# Patient Record
Sex: Female | Born: 1970 | Hispanic: Refuse to answer | Marital: Married | Smoking: Never smoker
Health system: Southern US, Community
[De-identification: ages and names within clinical notes are randomized; demographics above are authoritative.]

## PROBLEM LIST (undated history)

## (undated) DIAGNOSIS — N289 Disorder of kidney and ureter, unspecified: Secondary | ICD-10-CM

## (undated) DIAGNOSIS — M1990 Unspecified osteoarthritis, unspecified site: Secondary | ICD-10-CM

## (undated) DIAGNOSIS — E785 Hyperlipidemia, unspecified: Secondary | ICD-10-CM

## (undated) HISTORY — PX: APPENDECTOMY: SHX54

## (undated) HISTORY — PX: UTERINE FIBROID SURGERY: SHX826

## (undated) HISTORY — DX: Unspecified osteoarthritis, unspecified site: M19.90

## (undated) HISTORY — DX: Hyperlipidemia, unspecified: E78.5

---

## 1998-10-31 ENCOUNTER — Other Ambulatory Visit: Admission: RE | Admit: 1998-10-31 | Discharge: 1998-10-31 | Payer: Self-pay | Admitting: *Deleted

## 1999-06-03 ENCOUNTER — Inpatient Hospital Stay (HOSPITAL_COMMUNITY): Admission: AD | Admit: 1999-06-03 | Discharge: 1999-06-05 | Payer: Self-pay | Admitting: Obstetrics and Gynecology

## 2000-01-09 ENCOUNTER — Other Ambulatory Visit: Admission: RE | Admit: 2000-01-09 | Discharge: 2000-01-09 | Payer: Self-pay | Admitting: Obstetrics & Gynecology

## 2000-08-17 ENCOUNTER — Other Ambulatory Visit: Admission: RE | Admit: 2000-08-17 | Discharge: 2000-08-17 | Payer: Self-pay | Admitting: Obstetrics and Gynecology

## 2001-02-02 ENCOUNTER — Inpatient Hospital Stay (HOSPITAL_COMMUNITY): Admission: AD | Admit: 2001-02-02 | Discharge: 2001-02-02 | Payer: Self-pay | Admitting: Obstetrics and Gynecology

## 2001-03-05 ENCOUNTER — Inpatient Hospital Stay (HOSPITAL_COMMUNITY): Admission: AD | Admit: 2001-03-05 | Discharge: 2001-03-07 | Payer: Self-pay | Admitting: Obstetrics and Gynecology

## 2001-12-13 ENCOUNTER — Other Ambulatory Visit: Admission: RE | Admit: 2001-12-13 | Discharge: 2001-12-13 | Payer: Self-pay | Admitting: Obstetrics and Gynecology

## 2003-04-20 ENCOUNTER — Other Ambulatory Visit: Admission: RE | Admit: 2003-04-20 | Discharge: 2003-04-20 | Payer: Self-pay | Admitting: Obstetrics and Gynecology

## 2003-11-16 ENCOUNTER — Inpatient Hospital Stay (HOSPITAL_COMMUNITY): Admission: AD | Admit: 2003-11-16 | Discharge: 2003-11-18 | Payer: Self-pay | Admitting: Obstetrics and Gynecology

## 2003-12-13 ENCOUNTER — Encounter: Admission: RE | Admit: 2003-12-13 | Discharge: 2004-01-12 | Payer: Self-pay | Admitting: Obstetrics and Gynecology

## 2004-01-13 ENCOUNTER — Encounter: Admission: RE | Admit: 2004-01-13 | Discharge: 2004-02-12 | Payer: Self-pay | Admitting: Obstetrics and Gynecology

## 2004-03-14 ENCOUNTER — Encounter: Admission: RE | Admit: 2004-03-14 | Discharge: 2004-04-13 | Payer: Self-pay | Admitting: Obstetrics and Gynecology

## 2004-04-14 ENCOUNTER — Encounter: Admission: RE | Admit: 2004-04-14 | Discharge: 2004-05-14 | Payer: Self-pay | Admitting: Obstetrics and Gynecology

## 2004-06-14 ENCOUNTER — Encounter: Admission: RE | Admit: 2004-06-14 | Discharge: 2004-07-14 | Payer: Self-pay | Admitting: Obstetrics and Gynecology

## 2004-06-18 ENCOUNTER — Ambulatory Visit: Payer: Self-pay | Admitting: Family Medicine

## 2004-08-14 ENCOUNTER — Encounter: Admission: RE | Admit: 2004-08-14 | Discharge: 2004-08-27 | Payer: Self-pay | Admitting: Obstetrics and Gynecology

## 2005-01-15 ENCOUNTER — Ambulatory Visit: Payer: Self-pay | Admitting: Internal Medicine

## 2005-07-23 ENCOUNTER — Emergency Department (HOSPITAL_COMMUNITY): Admission: EM | Admit: 2005-07-23 | Discharge: 2005-07-23 | Payer: Self-pay | Admitting: Family Medicine

## 2005-08-08 ENCOUNTER — Other Ambulatory Visit: Admission: RE | Admit: 2005-08-08 | Discharge: 2005-08-08 | Payer: Self-pay | Admitting: Obstetrics and Gynecology

## 2005-08-25 ENCOUNTER — Encounter: Admission: RE | Admit: 2005-08-25 | Discharge: 2005-08-25 | Payer: Self-pay | Admitting: Obstetrics and Gynecology

## 2006-07-24 ENCOUNTER — Encounter: Admission: RE | Admit: 2006-07-24 | Discharge: 2006-07-24 | Payer: Self-pay | Admitting: Sports Medicine

## 2006-08-24 ENCOUNTER — Encounter: Admission: RE | Admit: 2006-08-24 | Discharge: 2006-08-24 | Payer: Self-pay | Admitting: Sports Medicine

## 2006-09-14 ENCOUNTER — Encounter: Admission: RE | Admit: 2006-09-14 | Discharge: 2006-09-14 | Payer: Self-pay | Admitting: Sports Medicine

## 2007-05-13 ENCOUNTER — Encounter: Payer: Self-pay | Admitting: Family Medicine

## 2007-10-01 ENCOUNTER — Inpatient Hospital Stay (HOSPITAL_COMMUNITY): Admission: AD | Admit: 2007-10-01 | Discharge: 2007-10-04 | Payer: Self-pay | Admitting: Obstetrics and Gynecology

## 2008-04-14 ENCOUNTER — Ambulatory Visit (HOSPITAL_COMMUNITY): Admission: RE | Admit: 2008-04-14 | Discharge: 2008-04-14 | Payer: Self-pay | Admitting: Obstetrics and Gynecology

## 2008-04-14 ENCOUNTER — Encounter (INDEPENDENT_AMBULATORY_CARE_PROVIDER_SITE_OTHER): Payer: Self-pay | Admitting: Obstetrics and Gynecology

## 2009-03-13 ENCOUNTER — Ambulatory Visit (HOSPITAL_BASED_OUTPATIENT_CLINIC_OR_DEPARTMENT_OTHER): Admission: RE | Admit: 2009-03-13 | Discharge: 2009-03-13 | Payer: Self-pay | Admitting: General Surgery

## 2009-03-13 ENCOUNTER — Encounter (INDEPENDENT_AMBULATORY_CARE_PROVIDER_SITE_OTHER): Payer: Self-pay | Admitting: General Surgery

## 2009-08-29 ENCOUNTER — Encounter: Admission: RE | Admit: 2009-08-29 | Discharge: 2009-08-29 | Payer: Self-pay | Admitting: Obstetrics and Gynecology

## 2010-08-20 ENCOUNTER — Ambulatory Visit
Admission: RE | Admit: 2010-08-20 | Discharge: 2010-08-20 | Payer: Self-pay | Source: Home / Self Care | Attending: Internal Medicine | Admitting: Internal Medicine

## 2010-08-20 ENCOUNTER — Other Ambulatory Visit: Payer: Self-pay | Admitting: Internal Medicine

## 2010-08-20 ENCOUNTER — Encounter: Payer: Self-pay | Admitting: Internal Medicine

## 2010-08-20 DIAGNOSIS — J0100 Acute maxillary sinusitis, unspecified: Secondary | ICD-10-CM | POA: Insufficient documentation

## 2010-08-20 LAB — BASIC METABOLIC PANEL
BUN: 13 mg/dL (ref 6–23)
CO2: 30 mEq/L (ref 19–32)
Calcium: 9.1 mg/dL (ref 8.4–10.5)
Chloride: 104 mEq/L (ref 96–112)
Creatinine, Ser: 0.9 mg/dL (ref 0.4–1.2)
GFR: 78.28 mL/min (ref >60.00)
Glucose, Bld: 94 mg/dL (ref 70–99)
Potassium: 4.1 mEq/L (ref 3.5–5.1)
Sodium: 141 mEq/L (ref 135–145)

## 2010-08-20 LAB — CBC WITH DIFFERENTIAL/PLATELET
Basophils Absolute: 0 10*3/uL (ref 0.0–0.1)
Basophils Relative: 0.2 % (ref 0.0–3.0)
Eosinophils Absolute: 0.1 10*3/uL (ref 0.0–0.7)
Eosinophils Relative: 1.3 % (ref 0.0–5.0)
HCT: 37.1 % (ref 36.0–46.0)
Hemoglobin: 12.9 g/dL (ref 12.0–15.0)
Lymphocytes Relative: 18.3 % (ref 12.0–46.0)
Lymphs Abs: 1.7 10*3/uL (ref 0.7–4.0)
MCHC: 34.7 g/dL (ref 30.0–36.0)
MCV: 85.5 fl (ref 78.0–100.0)
Monocytes Absolute: 0.7 10*3/uL (ref 0.1–1.0)
Monocytes Relative: 7.2 % (ref 3.0–12.0)
Neutro Abs: 6.7 10*3/uL (ref 1.4–7.7)
Neutrophils Relative %: 73 % (ref 43.0–77.0)
Platelets: 260 10*3/uL (ref 150.0–400.0)
RBC: 4.34 Mil/uL (ref 3.87–5.11)
RDW: 13.9 % (ref 11.5–14.6)
WBC: 9.2 10*3/uL (ref 4.5–10.5)

## 2010-08-20 LAB — HEPATIC FUNCTION PANEL
ALT: 22 U/L (ref 0–35)
AST: 22 U/L (ref 0–37)
Albumin: 4.2 g/dL (ref 3.5–5.2)
Alkaline Phosphatase: 103 U/L (ref 39–117)
Bilirubin, Direct: 0.1 mg/dL (ref 0.0–0.3)
Total Bilirubin: 0.6 mg/dL (ref 0.3–1.2)
Total Protein: 7 g/dL (ref 6.0–8.3)

## 2010-08-20 LAB — LIPID PANEL
Cholesterol: 229 mg/dL — ABNORMAL HIGH (ref 0–200)
HDL: 40.4 mg/dL (ref >39.00)
Total CHOL/HDL Ratio: 6
Triglycerides: 155 mg/dL — ABNORMAL HIGH (ref 0.0–149.0)
VLDL: 31 mg/dL (ref 0.0–40.0)

## 2010-08-20 LAB — URINALYSIS, ROUTINE W REFLEX MICROSCOPIC
Bilirubin Urine: NEGATIVE
Hemoglobin, Urine: NEGATIVE
Ketones, ur: NEGATIVE
Leukocytes, UA: NEGATIVE
Nitrite: NEGATIVE
Specific Gravity, Urine: >=1.03 (ref 1.000–1.030)
Total Protein, Urine: NEGATIVE
Urine Glucose: NEGATIVE
Urobilinogen, UA: 0.2 (ref 0.0–1.0)
pH: 6 (ref 5.0–8.0)

## 2010-08-20 LAB — TSH: TSH: 0.41 u[IU]/mL (ref 0.35–5.50)

## 2010-08-20 LAB — LDL CHOLESTEROL, DIRECT: Direct LDL: 172.4 mg/dL

## 2010-08-21 DIAGNOSIS — E785 Hyperlipidemia, unspecified: Secondary | ICD-10-CM | POA: Insufficient documentation

## 2010-09-05 NOTE — Assessment & Plan Note (Signed)
Summary: NEW PT---[employer]--#---FORMER [facility] PT--PKG/OFF--STC   Vital Signs:  Patient profile:   40 year old female Height:      67 inches (170.18 cm) Weight:      164.8 pounds (74.91 kg) BMI:     25.90 O2 Sat:      96 % on Room air Temp:     98.4 degrees F (36.89 degrees C) oral Pulse rate:   93 / minute BP sitting:   100 / 70  (left arm) Cuff size:   large  Vitals Entered By: Orlan Leavens RMA (August 20, 2010 2:05 PM)  O2 Flow:  Room air CC: New patient Is Patient Diabetic? No Pain Assessment Patient in pain? no        Primary Care Provider:  Newt Lukes MD  CC:  New patient.  History of Present Illness: new pt to me and our practice, here to est care patient is here today for annual physical. Patient feels well and has no complaints.   also c/o sinus congestion onset 3 weeks ago -  precipitated by URI - ST and fever have improved but cont nasal discharge +max sinus pressure, teeth pain and HA - no allg rhinitis, +sick contacts tx by urg care with biaxin - ST improved but cont nasal discharge   Preventive Screening-Counseling & Management  Alcohol-Tobacco     Alcohol drinks/day: <1     Alcohol Counseling: not indicated; use of alcohol is not excessive or problematic     Smoking Status: never     Tobacco Counseling: not indicated; no tobacco use  Caffeine-Diet-Exercise     Does Patient Exercise: yes     Exercise Counseling: not indicated; exercise is adequate     Depression Counseling: not indicated; screening negative for depression  Safety-Violence-Falls     Seat Belt Counseling: not indicated; patient wears seat belts     Helmet Counseling: not indicated; patient wears helmet when riding bicycle/motocycle     Firearm Counseling: not indicated; uses recommended firearm safety measures     Violence Counseling: not indicated; no violence risk noted     Fall Risk Counseling: not indicated; no significant falls noted  Clinical Review  Panels:  Prevention   Last Mammogram:  No specific mammographic evidence of malignancy.  [facility].   Assessment: BIRADS 1. (08/29/2009)  Immunizations   Last Tetanus Booster:  Tdap (08/20/2010)   Current Medications (verified): 1)  Clarithromycin 250 Mg Tabs (Clarithromycin) .... 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 I have reviewed all other systems and they were negative.   Physical Exam  General:  alert, well-developed, well-nourished, and cooperative to examination.   fatigued appearing Head:  Normocephalic and atraumatic without obvious abnormalities. No apparent alopecia or balding. Eyes:  vision grossly intact; pupils equal, round and reactive to light.  conjunctiva and lids normal.    Ears:  normal pinnae bilaterally, without erythema, swelling, or  tenderness to palpation. TMs hazy, without effusion, or cerumen impaction. Hearing grossly normal bilaterally  Nose:  tender over maxillary sinus region B Mouth:  teeth and gums in good repair; mucous membranes moist, without lesions or ulcers. oropharynx clear without exudate, mild erythema. +PND Neck:  supple, full ROM, no masses, no thyromegaly; no thyroid nodules or tenderness. no JVD or carotid bruits.   Lungs:  normal respiratory effort, no intercostal retractions or use of accessory muscles; normal breath sounds bilaterally - no crackles and no  wheezes.    Heart:  normal rate, regular rhythm, no murmur, and no rub. BLE without edema. Abdomen:  soft, non-tender, normal bowel sounds, no distention; no masses and no appreciable hepatomegaly or splenomegaly.   Genitalia:  defer gyn Msk:  No deformity or scoliosis noted of thoracic or lumbar spine.   Neurologic:  alert & oriented X3 and cranial nerves II-XII symetrically intact.  strength normal in all extremities, sensation intact to light touch, and gait normal. speech fluent without dysarthria or aphasia; follows commands with good comprehension.  Skin:  no rashes, vesicles, ulcers, or erythema. No nodules or irregularity to palpation.  Psych:  Oriented X3, memory intact for recent and remote, normally interactive, good eye contact, not anxious appearing, not depressed appearing, and not agitated.      Impression & Recommendations:  Problem # 1:  PREVENTIVE HEALTH CARE (ICD-V70.0) Patient has been counseled on age-appropriate routine health concerns for screening and prevention. These are reviewed and up-to-date. Immunizations are up-to-date or declined. Labs ordered and ECG reviewed.  Orders: TLB-Lipid Panel (80061-LIPID) TLB-BMP (Basic Metabolic Panel-BMET) (80048-METABOL) TLB-CBC Platelet - w/Differential (85025-CBCD) TLB-Hepatic/Liver Function Pnl (80076-HEPATIC) TLB-TSH (Thyroid Stimulating Hormone) (84443-TSH) TLB-Udip w/ Micro (81001-URINE) EKG w/ Interpretation (93000)  Problem # 2:  ACUTE MAXILLARY SINUSITIS (ICD-461.0)  Her updated medication list for this problem includes:    Augmentin 875-125 Mg Tabs (Amoxicillin-pot clavulanate) .Marland Kitchen... 1 by mouth two times a day x 7 days    Nasonex 50 Mcg/act Susp (Mometasone furoate) .Marland Kitchen... 2 sprays each nostril every morning  Instructed on treatment. Call if symptoms persist or worsen.   Orders: Prescription Created Electronically [phone])  Complete Medication List: 1)  Augmentin 875-125 Mg Tabs (Amoxicillin-pot clavulanate)  .Marland Kitchen.. 1 by mouth two times a day x 7 days 2)  Nasonex 50 Mcg/act Susp (Mometasone furoate) .... 2 sprays each nostril every morning  Other Orders: Tdap => 74yrs IM (60454) Admin 1st Vaccine (09811)  Patient Instructions: 1)  it was good to see you today. 2)  test(s) ordered today - your results will be posted on the phone tree for review in 48-72 hours from the time of test completion; call [phone] and enter your 9 digit MRN (listed above on this page, just below your name); if any changes need to be made or there are abnormal results, you will be contacted directly.  3)  stop clarithromycin and start Augmentin for sinus infection symptoms - also start nose spray as discussed (nasonex or generic) - your prescriptions have been electronically submitted to your pharmacy. Please take as directed. Contact our office if you believe you're having problems with the medication(s).  4)  ok to use Afrin and breathe rite strips for congestion 5)  Please schedule a follow-up appointment annually for medical physical, call sooner if problems.  Prescriptions: NASONEX 50 MCG/ACT SUSP (MOMETASONE FUROATE) 2 sprays each nostril every morning  #1 x 1   Entered and Authorized by:   Newt Lukes MD  Signed by:   Newt Lukes MD on 08/20/2010   Method used:   Electronically to        [facility] [phone]* (retail)       [address]       [place], [place]  [address]       Ph: [phone] or [phone]       Fax: [phone]   RxID:   [id number] AUGMENTIN 875-125 MG TABS (AMOXICILLIN-POT CLAVULANATE) 1 by mouth two times a day x 7 days  #14 x 0   Entered and Authorized by:   Newt Lukes MD   Signed by:   Newt Lukes MD on 08/20/2010   Method used:   Electronically to        [facility] [phone]* (retail)       [address]       [place], [place]  [address]       Ph: [phone] or [phone]       Fax: [phone]   RxID:   [id number]    Orders Added: 1)   TLB-Lipid Panel [80061-LIPID] 2)  TLB-BMP (Basic Metabolic Panel-BMET) [80048-METABOL] 3)  TLB-CBC Platelet - w/Differential [85025-CBCD] 4)  TLB-Hepatic/Liver Function Pnl [80076-HEPATIC] 5)  TLB-TSH (Thyroid Stimulating Hormone) [84443-TSH] 6)  TLB-Udip w/ Micro [81001-URINE] 7)  EKG w/ Interpretation [93000] 8)  Tdap => 70yrs IM [90715] 9)  Admin 1st Vaccine [90471] 10)  New Patient 40-64 years [99386] 35)  New Patient Level II [99202] 12)  Prescription Created Electronically [phone]   Immunizations Administered:  Tetanus Vaccine:    Vaccine Type: Tdap    Site: left deltoid    Mfr: Sanofi Pasteur    Dose: 0.5 ml    Route: IM    Given by: Zella Ball Ewing CMA (AAMA)    Exp. Date: 11/21/2011    Lot #: [id number]    VIS given: 06/21/08 version given August 20, 2010.   Immunizations Administered:  Tetanus Vaccine:    Vaccine Type: Tdap    Site: left deltoid    Mfr: Sanofi Pasteur    Dose: 0.5 ml    Route: IM    Given by: Zella Ball Ewing CMA (AAMA)    Exp. Date: 11/21/2011    Lot #: [id number]    VIS given: 06/21/08 version given August 20, 2010.   Mammogram  Procedure date:  08/29/2009  Findings:      No specific mammographic evidence of malignancy.  [facility].   Assessment: BIRADS 1.

## 2010-11-09 LAB — CBC
HCT: 42.7 % (ref 36.0–46.0)
Hemoglobin: 13.8 g/dL (ref 12.0–15.0)
MCV: 88.9 fL (ref 78.0–100.0)

## 2010-11-09 LAB — PREGNANCY, URINE: Preg Test, Ur: NEGATIVE

## 2010-12-17 NOTE — Op Note (Signed)
NAMEJAYMIE, Jillian [name]           ACCOUNT NO.:  [id number]   MEDICAL RECORD NO.:  [id number]          PATIENT TYPE:  AMB   LOCATION:  [facility]                           [facility]ACILITY:  [facility]   PHYSICIAN:  Osborn Coho, M.D.   DATE OF BIRTH:  1970-10-30   DATE OF PROCEDURE:  04/14/2008  DATE OF DISCHARGE:                               OPERATIVE REPORT   PREOPERATIVE DIAGNOSES:  1. Chronic pelvic pain.  2. Menorrhagia.  3. Polyps.   POSTOPERATIVE DIAGNOSES:  1. Chronic pelvic pain.  2. Menorrhagia.  3. Polyps.   PROCEDURE:  1. Diagnostic laparoscopy.  2. Hysteroscopy.  3. D&C.   ATTENDING:  Osborn Coho, MD   ANESTHESIA:  General via endotracheal tube.   SPECIMENS TO PATHOLOGY:  Endometrial curettings.   FLUIDS:  1000 mL   URINE OUTPUT:  200 mL   ESTIMATED BLOOD LOSS:  Minimal.   Hysteroscopic fluid deficit of sorbitol, 55 mL.   COMPLICATIONS:  None   PROCEDURE:  The patient was taken to the operating room after the risks,  benefits, and alternatives were discussed with the patient.  The patient  verbalized understanding and consent signed and witnessed.  The patient  was placed under general anesthesia and prepped and draped in normal  sterile fashion in the dorsal lithotomy position.  A bivalve speculum  was placed in the patient's vagina and Hulka tenaculum placed for  intrauterine manipulation.  The bivalve speculum was removed and after  gowning and re-gloving, attention was then turned to the abdomen where a  10-mm umbilical incision was made.  The Veress needle was introduced  into the intraabdominal cavity and pneumoperitoneum achieved.  The 10-mm  trocar was then advanced to the intraabdominal cavity and laparoscope  introduced as well.  The patient was placed in Trendelenburg and there  appeared to be a window in the midline of the posterior cul-de-sac with  petechial lesions surrounding this area.  The petechial lesions were  mostly on the right lateral aspect  of this area.  The intraabdominal  cavity was irrigated as the patient was with small amount of blood in  the cavity likely secondary to menses.  There was also some adhesions  noted of the bowel to the bilateral adnexa.  The bilateral ovaries and  fallopian tubes otherwise appeared to be within normal limits.  The  patient was flattened and pneumoperitoneum was relieved and fascia  repaired at the umbilicus with a 0 Vicryl via a running stitch and the  skin repaired with 3-0 Monocryl via a subcuticular stitch.  Attention  was then turned to the perineum where the Hulka tenaculum was removed  after bivalve speculum was placed.  A sponge stick was placed on the  anterior lip of the cervix and uterus was already dilated and  hysteroscope was introduced and no obvious intracavitary lesions were  noted.  Curettage was performed and a gritty texture noted.  Curettings  was sent to pathology.  A paracervical block had been administered using  a total of 10 mL of 1% lidocaine prior to inserting the scope.  The  patient had also been given  10 mL of 0.5% Marcaine at the umbilical  incision prior to making the incision.  The hysteroscope was introduced  once again and no obvious intracavitary lesions were noted.  All  instruments were removed and there was some bleeding at the tenaculum  site where the Hulka tenaculum had been placed.  Silver nitrate was  applied to this area and good hemostasis was noted.  Count was correct.  The patient tolerated the procedure well and has been transferred to the  recovery room in good condition.      Osborn Coho, M.D.  Electronically Signed     AR/MEDQ  D:  04/14/2008  T:  04/15/2008  Job:  [id number]

## 2010-12-17 NOTE — H&P (Signed)
Jillian [name], [name]           ACCOUNT NO.:  [id number]   MEDICAL RECORD NO.:  [id number]          PATIENT TYPE:  INP   LOCATION:  9175                          [facility]ACILITY:  [facility]   PHYSICIAN:  Osborn Coho, M.D.   DATE OF BIRTH:  1971-04-12   DATE OF ADMISSION:  10/01/2007  DATE OF DISCHARGE:                              HISTORY & PHYSICAL   HISTORY OF PRESENT ILLNESS:  [name] is a 40 year old  gravida 5,  para 3-0-1-3 at 30 weeks who presented with uterine contractions every 4  minutes, decreased strength over the last 24 hours with nausea and  vomiting.  Her pregnancy has been remarkable for:  1. History of rapid labor.  2. History of large for gestational age infant x2.  3. History of positive group B strep, negative this pregnancy.  4. 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  5. History of cardiac consult secondary to tachycardia with negative      findings.  6. Advanced maternal age with negative or normal nuchal translucency,      and normal AFP.   PRENATAL LABS:  Blood type is A+, Rh antibody negative, VDRL  nonreactive, rubella titer positive, hepatitis B surface antigen  negative, HIV was nonreactive, GC and chlamydia cultures were negative  in the first trimester.  PAP was done prior just prior to pregnancy, and  was normal.  Cystic fibrosis testing was negative.  TSH was done after  her first visit secondary to report of racing heart beat, and it was  0.356, hemoglobin upon entering the practice was 12.7.  It was within  normal limits at 28 weeks.  Group B strep culture was negative at 36  weeks Glucola was also normal.  First trimester screen was normal, and  AFP was normal.   HISTORY OF PRESENT PREGNANCY:  The patient entered care at approximately  10-11 weeks.  She had some history of racing heart rate and hair loss.  TSH was done, and was within normal limits.  She is also treated for BV  at her first visit.  She also had a cardiology  consult. AFP and first  trimester screen were normal.  She had an ultrasound at 18 weeks showing  normal growth.  EDC of October 14, 2007 was supported, and no anomalies  were noted.  She did have some varicose veins at 18 weeks.  Her Glucola  monitor was normal.  She did not have an echo done.  Holter monitor  results were normal.  The rest of her pregnancy was essentially  uncomplicated.   OBSTETRICAL HISTORY:  In 2000 she had a vaginal birth of a female infant,  weight 9 pounds 3 ounces at 41+ weeks.  She was in labor 24+ hours.  She  had epidural anesthesia.  She had no complications.  In 2002 she had a  vaginal birth of a female infant, weight 8 pounds 13 ounces at 39-3/7  weeks.  She was in labor approximately 4 hours, and had local  anesthesia.  In 2005 she had a vaginal birth of a female infant, weight  9 pounds  at 40 weeks.  She was in labor 4 hours, and had epidural  anesthesia.  In 2007 she had a 6-week spontaneous miscarriage.   MEDICAL HISTORY:  1. In 1997 she had an abnormal Pap and had some type of treatment for      her cervix.  The patient was not sure whether it was a LEEP      procedure or a cone biopsy.  2. She had a small fibroid noted in 2007.  3. She reports the usual childhood illnesses.  4. She had a history and 1996 of a UTI and pyelonephritis.  5. She had a fracture of the right ankle at age 48.   SURGICAL HISTORY INCLUDES:  1. Appendectomy in 1985.  2. Her only other hospitalization was for childbirth.   ALLERGIES:  She is allergic to [facility] which causes itching.   FAMILY HISTORY:  Her father has hypertension.  Her mother has varicose  veins.  Her brother had an aneurysm.  Her mother had anemia.  Her  maternal grandmother and paternal grandmother had strokes.  Paternal  grandfather had Alzheimer's dementia.  Maternal grandfather had lung  cancer.  Maternal grandfather and maternal uncle were smokers.  Maternal  uncle and brothers were alcohol users.    GENETIC HISTORY REMARKABLE FOR:  The patient's age of 72 at the time of  delivery.  The patient's niece had polydactyly.  Her niece also had a  cyst on the neck.  The patient's nephew had a hole in the heart, but did  not require surgery.   SOCIAL HISTORY:  The patient is married to the father of baby.  He is  involved and supportive.  His name is Gaffer.  The patient is  Caucasian of the [place] faith.  She is college educated.  She is a  Sports administrator.  Her husband has college plus education, and is a stay-  at-home dad.  She has been followed by the certified nurse midwife  service [facility].  She denies any alcohol, drug, or tobacco  use during this pregnancy.   PHYSICAL EXAM:  VITAL SIGNS:  Stable.  The patient is febrile.  HEENT: Within normal limits.  LUNGS:  Breath sounds are clear.  HEART:  Regular rate and rhythm without murmur.  BREASTS:  Soft and nontender.  ABDOMEN:  Fundal height is approximately 38 cm.  Estimated fetal weight  is 8 to 8-1/2 pounds.  Uterine contractions are every 4 minutes,  moderate quality.  Cervix is 3 cm, 70% vertex at a -1 to -2 station.  Membranes are intact.  Fetal heart rate is reactive with no  decelerations.  Uterine contractions currently are every 4 minutes.  There are no HSV lesions noted, and the patient also denies any  prodrome.  EXTREMITIES:  Deep tendon reflexes are 2+ without clonus.  There is  trace of edema noted.   IMPRESSION:  1. Intrauterine pregnancy at 38 weeks.  2. Early labor.  3. Negative group B strep.   PLAN:  1. Admit to birthing suites to Dr. Su Hilt who is attending physician.  2. Routine certified nurse midwife orders.  3. Will plan artificial rupture of membranes as needed.  May want      epidural as labor progresses, and we will augment as needed.      Renaldo Reel Emilee Hero, C.N.M.      Osborn Coho, M.D.  Electronically Signed    VLL/MEDQ  D:  10/01/2007  T:  10/01/2007  Job:   [id number]

## 2010-12-17 NOTE — Op Note (Signed)
Jillian, [name]           ACCOUNT NO.:  [id number]   MEDICAL RECORD NO.:  [id number]          PATIENT TYPE:  AMB   LOCATION:  [facility]                         [facility]   PHYSICIAN:  Anselm Pancoast. Weatherly, M.D.DATE OF BIRTH:  09/14/1970   DATE OF PROCEDURE:  03/13/2009  DATE OF DISCHARGE:                               OPERATIVE REPORT   PREOPERATIVE DIAGNOSIS:  Internal and external hemorrhoids,  predominantly the patient's right, surgeon examined on the left.   POSTOPERATIVE DIAGNOSIS:  Internal and external hemorrhoids,  predominantly posterior quadrants.   OPERATION:  Internal and external hemorrhoidectomy, 2 quadrants.  General anesthesia.  Lithotomy position.   HISTORY:  Jillian [name] is a 40 year old mother of 4 who was  referred to me by her gynecologist.  She is approximately a year  postpartum and has had problems with hemorrhoids after each of her  children.  She has 4 children, pregnancies in 2000, 2002, 2005, and  2009, and she works as a Environmental health practitioner.  She was seen in  the office approximately a month ago at which time the hemorrhoid on the  patient's left was the largest, really more hemorrhoidal tissue than a  rubber band could manage, and since this has been progressive problem, I  recommended that we excise the area under general anesthesia.  She is  hopeful that can get back to work as soon as possible, and whether a  single quadrant will be performed or both according to what was noted at  the time of her surgery.  She is here today, and on questioning, she  said that really appears to be both sides.  She cannot really tell  whether it is more one side or the other, and permission was obtained to  do the second quadrant if needed.  The patient was taken to the  operative suite.  Induction of general anesthesia, LOA tube, and placed  up in the yellow fin stirrups.  With her in this position, it appears  that she has got really  hemorrhoids in both the left and right,  posterior quadrants are the largest.  I prepped her with Betadine  surgical scrub and solution and draped in a sterile manner and then used  Marcaine with adrenaline and anesthetized approximately 10 mL on the  left and right side.  With the patient in the sterile manner with the  anoscope, it appears that the patient's right is the larger quadrant,  now posterior, but she has got more tissue on the left side than I would  like to leave.  The little area in the front is not as large, and I  elected to go ahead and do the right posterior lateral quadrant,  elevating it of the sphincter, not really stretching the external  sphincter that much, and then the little secondary veins right  externally were sutured with 3-0 chromic, and then using the Bowie clamp  under the large internal quadrant clamped and then removed the  hemorrhoid and then sutured with about 3 U stitches of 2-0 chromic and  then started at the apex with suture of the  internal hemorrhoidal  vessels, then sutured over the clamp, removed the clamp and then closed  the hemorrhoidectomy incision with a running 2-0 chromic.  The external  portion, I did use a few little interrupted 3-0 chromic exteriorly, and  there appears to be good hemostasis.  On the left side, the hemorrhoid  is much smaller.  I elevated it off of the sphincter in a similar manner  and then clamped it with a Bowie.  We used a single U stitch and then  running 2-0 chromic closing the external hemorrhoidectomy incision.  Reinspection of both suture lines revealed good hemostasis with  Xylocaine ointment 5% within the anal canal, using open 4x4 to kind of  hold the ointment in place and then 4x4s and ABDs with the  stretch panties.  The patient tolerated the procedure nicely, hopefully  will have no trouble voiding, and I will see her back in the office in  approximately one week.  She has made arrangements to definitely  be off  work for at least 1 week and hopefully can be limited activity  afterwards.      Anselm Pancoast. Zachery Dakins, M.D.  Electronically Signed     WJW/MEDQ  D:  03/13/2009  T:  03/13/2009  Job:  [id number]
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
## 2011-01-22 ENCOUNTER — Other Ambulatory Visit: Payer: Self-pay | Admitting: Obstetrics and Gynecology

## 2011-01-22 DIAGNOSIS — Z1231 Encounter for screening mammogram for malignant neoplasm of breast: Secondary | ICD-10-CM

## 2011-01-30 ENCOUNTER — Ambulatory Visit
Admission: RE | Admit: 2011-01-30 | Discharge: 2011-01-30 | Disposition: A | Discharge status: Home / Self Care | Payer: 59 | Source: Ambulatory Visit | Attending: Obstetrics and Gynecology | Admitting: Obstetrics and Gynecology

## 2011-01-30 DIAGNOSIS — Z1231 Encounter for screening mammogram for malignant neoplasm of breast: Secondary | ICD-10-CM

## 2011-04-25 LAB — CBC
HCT: 31.1 — ABNORMAL LOW
Hemoglobin: 10.1 — ABNORMAL LOW
Hemoglobin: 11.1 — ABNORMAL LOW
MCHC: 35.9
MCHC: 36
MCV: 88.4
Platelets: 136 — ABNORMAL LOW
RBC: 3.51 — ABNORMAL LOW
RDW: 16.5 — ABNORMAL HIGH
WBC: 11.3 — ABNORMAL HIGH
WBC: 16.1 — ABNORMAL HIGH

## 2011-04-25 LAB — RPR: RPR Ser Ql: NONREACTIVE

## 2011-04-28 LAB — CBC
HCT: 29.1 — ABNORMAL LOW
Hemoglobin: 10.4 — ABNORMAL LOW
MCHC: 35.6
MCV: 89.7
RBC: 3.25 — ABNORMAL LOW
RDW: 16 — ABNORMAL HIGH

## 2011-05-07 LAB — CBC
Hemoglobin: 14
MCHC: 34.3
RBC: 4.69
RDW: 13.7
WBC: 5

## 2011-05-07 LAB — HCG, SERUM, QUALITATIVE: Preg, Serum: NEGATIVE

## 2011-10-09 ENCOUNTER — Ambulatory Visit (INDEPENDENT_AMBULATORY_CARE_PROVIDER_SITE_OTHER): Payer: 59 | Admitting: Family Medicine

## 2011-10-09 VITALS — BP 125/86 | HR 69 | Temp 98.4°F | Resp 16 | Ht 66.25 in | Wt 168.2 lb

## 2011-10-09 DIAGNOSIS — IMO0002 Reserved for concepts with insufficient information to code with codable children: Secondary | ICD-10-CM

## 2011-10-09 DIAGNOSIS — L089 Local infection of the skin and subcutaneous tissue, unspecified: Secondary | ICD-10-CM

## 2011-10-09 MED ORDER — SULFAMETHOXAZOLE-TRIMETHOPRIM 800-160 MG PO TABS: 1.0000 | ORAL_TABLET | Freq: Two times a day (BID) | ORAL | Status: AC

## 2011-10-09 NOTE — Progress Notes (Signed)
  [facility]:  Office Visit  Chief Complaint:  Chief Complaint  Patient presents with  . Laceration    left index finger under the nail    HPI: Jillian [name] is a 41 y.o. female who complains of left index finger pain, after opening box, cardboard box, digged into her finger, ? Cut x 2 days. Cleaned with soap and water. No fevers, chills. + pain, swelling.  Treated with abx neosporin  History reviewed. No pertinent past medical history. Past Surgical History  Procedure Date  . Appendectomy    History   Social History  . Marital Status: Married    Spouse Name: N/A    Number of Children: N/A  . Years of Education: N/A   Social History Main Topics  . Smoking status: Never Smoker   . Smokeless tobacco: None  . Alcohol Use: Yes  . Drug Use: No  . Sexually Active: None   Other Topics Concern  . None   Social History Narrative  . None   Family History  Problem Relation Age of Onset  . Heart disease Mother    Allergies  Allergen Reactions  . Ultram (Tramadol Hcl) Itching   Prior to Admission medications   Not on File     ROS: The patient denies fevers, chills, night sweats, unintentional weight loss, chest pain, palpitations, wheezing, dyspnea on exertion, nausea, vomiting, abdominal pain, dysuria, hematuria, melena, numbness, weakness, or tingling. + finger pain and swelling  All other systems have been reviewed and were otherwise negative with the exception of those mentioned in the HPI and as above.    PHYSICAL EXAM: Filed Vitals:   10/09/11 1927  BP: 125/86  Pulse: 69  Temp: 98.4 F (36.9 C)  Resp: 16   Filed Vitals:   10/09/11 1927  Height: 5' 6.25" (1.683 m)  Weight: 168 lb 3.2 oz (76.295 kg)   Body mass index is 26.94 kg/(m^2).  General: Alert, no acute distress HEENT:  Normocephalic, atraumatic, oropharynx patent.  Cardiovascular:  Regular rate and rhythm, no rubs murmurs or gallops.  No Carotid bruits, radial pulse  intact. No pedal edema.  Respiratory: Clear to auscultation bilaterally.  No wheezes, rales, or rhonchi.  No cyanosis, no use of accessory musculature GI: No organomegaly, abdomen is soft and non-tender, positive bowel sounds.  No masses. Skin: No rashes. + wound on left index finger on medial edge of nail, mild swelling and mild redness and tender with palpation Neurologic: Facial musculature symmetric. Full flexion, extension of PIP, DIP, full ROM, sensation intact Psychiatric: Patient is appropriate throughout our interaction. Lymphatic: No cervical lymphadenopathy Musculoskeletal: Gait intact.   LABS:   EKG/XRAY:   Primary read interpreted by Dr. Conley Rolls at [facility].   ASSESSMENT/PLAN: Encounter Diagnoses  Name Primary?  . Skin infection Yes  . Paronychia   . Finger infection    I think this is an early stage of a nail infection/paronychia. Advise patient to monitor for worse sxs which may require incision and draiange. It is not at that point yet. Rx Bactrim DS and warm water soaks BID    Kyheem Bathgate PHUONG, DO 10/11/2011 11:22 AM

## 2011-10-10 ENCOUNTER — Telehealth: Payer: Self-pay

## 2011-10-10 NOTE — Telephone Encounter (Signed)
Pt advised and understood. 

## 2011-10-10 NOTE — Telephone Encounter (Signed)
Note reviewed - tx appears appropriate -  continue antibiotics as rx'd -  Also, she should soak finger in warm soapy water 3x/d for 5-10 min per soak for next 3 days, then as needed - call if increasing redness or pain

## 2011-10-10 NOTE — Telephone Encounter (Signed)
Pt called stating she was seen at [facility] yesterday for infection of index finger. Pt is requesting MD review UC notes and advise.

## 2011-10-10 NOTE — Telephone Encounter (Signed)
Pt called stating she was seen at UC yesterday for infection of index finger. Pt is requesting MD review UC notes and advise.  

## 2012-03-31 ENCOUNTER — Ambulatory Visit (INDEPENDENT_AMBULATORY_CARE_PROVIDER_SITE_OTHER): Payer: 59 | Admitting: Obstetrics and Gynecology

## 2012-03-31 ENCOUNTER — Encounter: Payer: Self-pay | Admitting: Obstetrics and Gynecology

## 2012-03-31 VITALS — BP 100/66 | Resp 16 | Ht 67.0 in | Wt 160.0 lb

## 2012-03-31 DIAGNOSIS — N946 Dysmenorrhea, unspecified: Secondary | ICD-10-CM

## 2012-03-31 DIAGNOSIS — Z124 Encounter for screening for malignant neoplasm of cervix: Secondary | ICD-10-CM

## 2012-03-31 MED ORDER — HYDROCODONE-ACETAMINOPHEN 5-500 MG PO TABS: 1.0000 | ORAL_TABLET | ORAL | Status: DC | PRN

## 2012-03-31 MED ORDER — HYDROCODONE-ACETAMINOPHEN 5-500 MG PO TABS: 1.0000 | ORAL_TABLET | ORAL | Status: AC | PRN

## 2012-03-31 NOTE — Patient Instructions (Signed)
Endometrial Ablation Endometrial ablation removes the lining of the uterus (endometrium). It is usually a same day, outpatient treatment. Ablation helps avoid major surgery (such as a hysterectomy). A hysterectomy is removal of the cervix and uterus. Endometrial ablation has less risk and complications, has a shorter recovery period and is less expensive. After endometrial ablation, most women will have little or no menstrual bleeding. You may not keep your fertility. Pregnancy is no longer likely after this procedure but if you are pre-menopausal, you still need to use a reliable method of birth control following the procedure because pregnancy can occur. REASONS TO HAVE THE PROCEDURE MAY INCLUDE:  Heavy periods.   Bleeding that is causing anemia.   Anovulatory bleeding, very irregular, bleeding.   Bleeding submucous fibroids (on the lining inside the uterus) if they are smaller than 3 centimeters.  REASONS NOT TO HAVE THE PROCEDURE MAY INCLUDE:  You wish to have more children.   You have a pre-cancerous or cancerous problem. The cause of any abnormal bleeding must be diagnosed before having the procedure.   You have pain coming from the uterus.   You have a submucus fibroid larger than 3 centimeters.   You recently had a baby.   You recently had an infection in the uterus.   You have a severe retro-flexed, tipped uterus and cannot insert the instrument to do the ablation.   You had a Cesarean section or deep major surgery on the uterus.   The inner cavity of the uterus is too large for the endometrial ablation instrument.  RISKS AND COMPLICATIONS   Perforation of the uterus.   Bleeding.   Infection of the uterus, bladder or vagina.   Injury to surrounding organs.   Cutting the cervix.   An air bubble to the lung (air embolus).   Pregnancy following the procedure.   Failure of the procedure to help the problem requiring hysterectomy.   Decreased ability to diagnose  cancer in the lining of the uterus.  BEFORE THE PROCEDURE  The lining of the uterus must be tested to make sure there is no pre-cancerous or cancer cells present.   Medications may be given to make the lining of the uterus thinner.   Ultrasound may be used to evaluate the size and look for abnormalities of the uterus.   Future pregnancy is not desired.  PROCEDURE  There are different ways to destroy the lining of the uterus.   Resectoscope - radio frequency-alternating electric current is the most common one used.   Cryotherapy - freezing the lining of the uterus.   Heated Free Liquid - heated salt (saline) solution inserted into the uterus.   Microwave - uses high energy microwaves in the uterus.   Thermal Balloon - a catheter with a balloon tip is inserted into the uterus and filled with heated fluid.  Your caregiver will talk with you about the method used in this clinic. They will also instruct you on the pros and cons of the procedure. Endometrial ablation is performed along with a procedure called operative hysteroscopy. A narrow viewing tube is inserted through the birth canal (vagina) and through the cervix into the uterus. A tiny camera attached to the viewing tube (hysteroscope) allows the uterine cavity to be shown on a TV monitor during surgery. Your uterus is filled with a harmless liquid to make the procedure easier. The lining of the uterus is then removed. The lining can also be removed with a resectoscope which allows your surgeon   to cut away the lining of the uterus under direct vision. Usually, you will be able to go home within an hour after the procedure. HOME CARE INSTRUCTIONS   Do not drive for 24 hours.   No tampons, douching or intercourse for 2 weeks or until your caregiver approves.   Rest at home for 24 to 48 hours. You may then resume normal activities unless told differently by your caregiver.   Take your temperature two times a day for 4 days, and record  it.   Take any medications your caregiver has ordered, as directed.   Use some form of contraception if you are pre-menopausal and do not want to get pregnant.  Bleeding after the procedure is normal. It varies from light spotting and mildly watery to bloody discharge for 4 to 6 weeks. You may also have mild cramping. Only take over-the-counter or prescription medicines for pain, discomfort, or fever as directed by your caregiver. Do not use aspirin, as this may aggravate bleeding. Frequent urination during the first 24 hours is normal. You will not know how effective your surgery is until at least 3 months after the surgery. SEEK IMMEDIATE MEDICAL CARE IF:   Bleeding is heavier than a normal menstrual cycle.   An oral temperature above 102 F (38.9 C) develops.   You have increasing cramps or pains not relieved with medication or develop belly (abdominal) pain which does not seem to be related to the same area of earlier cramping and pain.   You are light headed, weak or have fainting episodes.   You develop pain in the shoulder strap areas.   You have chest or leg pain.   You have abnormal vaginal discharge.   You have painful urination.  Document Released: 05/30/2004 Document Revised: 07/10/2011 Document Reviewed: 08/28/2007 Exit[employer] Patient Information 2012 [employer]. 

## 2012-03-31 NOTE — Progress Notes (Signed)
Regular Periods: yes every 3 weeks  Mammogram: no 01/2011 WNL  Monthly Breast Ex.: yes Exercise: yes  Tetanus < 10 years: unsure per pt  Seatbelts: yes  NI. Bladder Functn.: yes Abuse at home: no  Daily BM's: yes Stressful Work: yes  Healthy Diet: yes Sigmoid-Colonoscopy: never   Calcium: no Medical problems this year: c/o back & neck issues "stenosis",  & disks problems     Contraception: hus vas  Mammogram:  01/2011 WNL  PCP: Rene Paci  PMH: no changes   FMH: mother 2 stents 2012 possible heart attack   Last Bone Scan: never

## 2012-03-31 NOTE — Progress Notes (Signed)
Subjective:    Jillian [name] is a 41 y.o. female, G5P0014, who presents for an annual exam.   Patient reports cycles are still short (q 3 weeks), and heavy with cramping.  Uses Vicodin sporadically for dysmenorrhea. Husband with vasectomy.  Patient has declined hormonal control for cycles in past.  Mother had atypical SOB, resulted in 2 stents placed for 100% blockages.    History   Social History  . Marital Status: Married    Spouse Name: N/A    Number of Children: N/A  . Years of Education: N/A   Social History Main Topics  . Smoking status: Never Smoker   . Smokeless tobacco: Never Used  . Alcohol Use: No  . Drug Use: No  . Sexually Active: Yes -- Female partner(s)    Birth Control/ Protection: Surgical     hus vas    Other Topics Concern  . None   Social History Narrative  . None    Menstrual cycle:   LMP: Patient's last menstrual period was 03/10/2012.           Cycle: q 3 weeks, with dysmenorrhea and heavy flow at times.  The following portions of the patient's history were reviewed and updated as appropriate: allergies, current medications, past family history, past medical history, past social history, past surgical history and problem list.  Review of Systems Pertinent items are noted in HPI. Breast:Negative for breast lump,nipple discharge or nipple retraction Gastrointestinal: Negative for abdominal pain, change in bowel habits or rectal bleeding Urinary:negative   Objective:    BP 100/66  Resp 16  LMP 03/10/2012    Weight:  Wt Readings from Last 1 Encounters:  10/09/11 168 lb 3.2 oz (76.295 kg)          BMI: There is no height or weight on file to calculate BMI.  General Appearance: Alert, appropriate appearance for age. No acute distress HEENT: Grossly normal Neck / Thyroid: Supple, no masses, nodes or enlargement Lungs: clear to auscultation bilaterally Back: No CVA tenderness Breast Exam: No dimpling, nipple retraction or discharge. No  masses or nodes. and No masses or nodes.No dimpling, nipple retraction or discharge. Cardiovascular: Regular rate and rhythm. S1, S2, no murmur Gastrointestinal: Soft, non-tender, no masses or organomegaly Pelvic Exam: Vulva and vagina appear normal. Bimanual exam reveals normal uterus and adnexa. Rectovaginal: normal rectal, no masses Lymphatic Exam: Non-palpable nodes in neck, clavicular, axillary, or inguinal regions Skin: no rash or abnormalities Neurologic: Normal gait and speech, no tremor  Psychiatric: Alert and oriented, appropriate affect.   Wet Prep:not applicable Urinalysis:not applicable UPT: Not done   Assessment:    Normal gyn exam  Dysmenorrhea/sporadic menorrhagia   Plan:    Mammogram: 6/12--patient was unsure of last mammogram Pap:  Done today STD screening: declined Contraception:vasectomy Other:  Discussed options of Mirena, Lysteda, Novasure for cycle issues.  Patient interested in [place].  Procedure discussed, written resources given.  Patient will f/u with [facility] if she wishes to discuss further. Rx Vicodin 1 po q 4 hours prn dysmenorrhea called to patient's pharmacy.      Nyra Capes, [place]

## 2012-04-02 LAB — PAP IG W/ RFLX HPV ASCU

## 2012-04-07 ENCOUNTER — Other Ambulatory Visit: Payer: Self-pay | Admitting: Obstetrics and Gynecology

## 2012-04-07 DIAGNOSIS — Z1231 Encounter for screening mammogram for malignant neoplasm of breast: Secondary | ICD-10-CM

## 2012-04-14 ENCOUNTER — Ambulatory Visit (INDEPENDENT_AMBULATORY_CARE_PROVIDER_SITE_OTHER): Payer: 59 | Admitting: Internal Medicine

## 2012-04-14 ENCOUNTER — Encounter: Payer: Self-pay | Admitting: Internal Medicine

## 2012-04-14 VITALS — BP 112/72 | HR 81 | Temp 98.5°F

## 2012-04-14 DIAGNOSIS — E785 Hyperlipidemia, unspecified: Secondary | ICD-10-CM

## 2012-04-14 DIAGNOSIS — L710 Perioral dermatitis: Secondary | ICD-10-CM

## 2012-04-14 DIAGNOSIS — Z23 Encounter for immunization: Secondary | ICD-10-CM

## 2012-04-14 DIAGNOSIS — H9209 Otalgia, unspecified ear: Secondary | ICD-10-CM

## 2012-04-14 DIAGNOSIS — L719 Rosacea, unspecified: Secondary | ICD-10-CM

## 2012-04-14 MED ORDER — ANTIPYRINE-BENZOCAINE 5.4-1.4 % OT SOLN: 3.0000 [drp] | OTIC | Status: AC | PRN

## 2012-04-14 MED ORDER — METRONIDAZOLE 1 % EX GEL: Freq: Every day | CUTANEOUS | Status: AC

## 2012-04-14 NOTE — Progress Notes (Signed)
  Subjective:    Patient ID: Jillian [name], female    DOB: 09-29-1970, 41 y.o.   MRN: [id number]  Otalgia  There is pain in the left ear. This is a chronic problem. The current episode started more than 1 month ago. The problem occurs constantly. The problem has been unchanged. There has been no fever. Associated symptoms include a sore throat. She has tried ear drops, NSAIDs and acetaminophen for the symptoms. The treatment provided no relief. There is no history of a chronic ear infection or hearing loss.   also complains of recurrent rash near mouth, esthetician has recommended medical evaluation for treatment  Past Medical History  Diagnosis Date  . Hyperlipidemia     Review of Systems  HENT: Positive for ear pain and sore throat.        Objective:   Physical Exam BP 112/72  Pulse 81  Temp 98.5 F (36.9 C) (Oral)  SpO2 98%  LMP 04/08/2012 Wt Readings from Last 3 Encounters:  03/31/12 160 lb (72.576 kg)  10/09/11 168 lb 3.2 oz (76.295 kg)  08/20/10 164 lb 12.8 oz (74.753 kg)   Constitutional: She appears well-developed and well-nourished. No distress.  HENT: Head: Normocephalic and atraumatic. Ears: Left ear canal with inflammation and abrasion, no debris. B TMs ok, no erythema or effusion; Nose: Nose normal. Mouth/Throat: Oropharynx is clear and moist. No oropharyngeal exudate.  Eyes: Conjunctivae and EOM are normal. Pupils are equal, round, and reactive to light. No scleral icterus.  Neck: Normal range of motion. Neck supple. No JVD present. No thyromegaly present.  Cardiovascular: Normal rate, regular rhythm and normal heart sounds.  No murmur heard. No BLE edema. Pulmonary/Chest: Effort normal and breath sounds normal. No respiratory distress. She has no wheezes.  Skin: Mild right-sided perioral dermatitis. Remaining skin is warm and dry. No rash noted. No erythema.  Psychiatric: She has a normal mood and affect. Her behavior is normal. Judgment and thought content  normal.   Lab Results  Component Value Date   WBC 9.2 08/20/2010   HGB 12.9 08/20/2010   HCT 37.1 08/20/2010   PLT 260.0 08/20/2010   GLUCOSE 94 08/20/2010   CHOL 229* 08/20/2010   TRIG 155.0* 08/20/2010   HDL 40.40 08/20/2010   LDLDIRECT 172.4 08/20/2010   ALT 22 08/20/2010   AST 22 08/20/2010   NA 141 08/20/2010   K 4.1 08/20/2010   CL 104 08/20/2010   CREATININE 0.9 08/20/2010   BUN 13 08/20/2010   CO2 30 08/20/2010   TSH 0.41 08/20/2010       Assessment & Plan:  Left ear pain due to abrasion -no evidence of external otitis or middle ear infection -analgesic drops when necessary. Education and reassurance provided  Perioral dermatitis - MetroGel daily when necessary. Electronic prescription provided  Also see problem list. Medications and labs reviewed today.

## 2012-04-14 NOTE — Assessment & Plan Note (Signed)
Never on medications for same, previously advised diet and exercise for control in early 2012 Reviewed last lipids, recommended physical and followup The patient is asked to make an attempt to improve diet and exercise patterns to aid in medical management of this problem.

## 2012-04-14 NOTE — Patient Instructions (Signed)
It was good to see you today. Ear drops and Metrogel as discussed Your prescription(s) have been submitted to your pharmacy. Please take as directed and contact our office if you believe you are having problem(s) with the medication(s). Flu shot

## 2012-04-30 ENCOUNTER — Ambulatory Visit
Admission: RE | Admit: 2012-04-30 | Discharge: 2012-04-30 | Disposition: A | Discharge status: Home / Self Care | Payer: 59 | Source: Ambulatory Visit | Attending: Obstetrics and Gynecology | Admitting: Obstetrics and Gynecology

## 2012-04-30 DIAGNOSIS — Z1231 Encounter for screening mammogram for malignant neoplasm of breast: Secondary | ICD-10-CM

## 2013-04-21 ENCOUNTER — Other Ambulatory Visit: Payer: Self-pay

## 2013-04-21 DIAGNOSIS — Z1231 Encounter for screening mammogram for malignant neoplasm of breast: Secondary | ICD-10-CM

## 2013-05-03 ENCOUNTER — Ambulatory Visit
Admission: RE | Admit: 2013-05-03 | Discharge: 2013-05-03 | Disposition: A | Discharge status: Home / Self Care | Payer: 59 | Source: Ambulatory Visit

## 2013-05-03 DIAGNOSIS — Z1231 Encounter for screening mammogram for malignant neoplasm of breast: Secondary | ICD-10-CM

## 2013-09-01 ENCOUNTER — Other Ambulatory Visit: Payer: Self-pay | Admitting: Physical Medicine and Rehabilitation

## 2013-09-01 DIAGNOSIS — M545 Low back pain, unspecified: Secondary | ICD-10-CM

## 2013-09-01 NOTE — Procedures (Signed)
°

## 2013-09-09 NOTE — ED Notes (Signed)
°

## 2013-09-09 NOTE — Procedures (Signed)
°

## 2013-09-12 ENCOUNTER — Ambulatory Visit
Admission: RE | Admit: 2013-09-12 | Discharge: 2013-09-12 | Disposition: A | Discharge status: Home / Self Care | Payer: 59 | Source: Ambulatory Visit | Attending: Physical Medicine and Rehabilitation | Admitting: Physical Medicine and Rehabilitation

## 2013-09-12 DIAGNOSIS — M545 Low back pain, unspecified: Secondary | ICD-10-CM

## 2013-09-12 NOTE — ED Notes (Signed)
°

## 2013-09-12 NOTE — Procedures (Signed)
°

## 2014-05-16 ENCOUNTER — Other Ambulatory Visit: Payer: Self-pay

## 2014-05-16 DIAGNOSIS — Z1231 Encounter for screening mammogram for malignant neoplasm of breast: Secondary | ICD-10-CM

## 2014-06-05 ENCOUNTER — Encounter: Payer: Self-pay | Admitting: Internal Medicine

## 2014-06-07 ENCOUNTER — Ambulatory Visit
Admission: RE | Admit: 2014-06-07 | Discharge: 2014-06-07 | Disposition: A | Discharge status: Home / Self Care | Payer: 59 | Source: Ambulatory Visit

## 2014-06-07 DIAGNOSIS — Z1231 Encounter for screening mammogram for malignant neoplasm of breast: Secondary | ICD-10-CM

## 2014-06-21 ENCOUNTER — Ambulatory Visit (INDEPENDENT_AMBULATORY_CARE_PROVIDER_SITE_OTHER): Payer: 59 | Admitting: Family Medicine

## 2014-06-21 ENCOUNTER — Encounter: Payer: Self-pay | Admitting: Family Medicine

## 2014-06-21 VITALS — BP 100/74 | HR 96 | Temp 98.9°F | Ht 67.0 in | Wt 168.0 lb

## 2014-06-21 DIAGNOSIS — M545 Low back pain, unspecified: Secondary | ICD-10-CM

## 2014-06-21 LAB — POCT URINALYSIS DIPSTICK
Bilirubin, UA: NEGATIVE
Blood, UA: NEGATIVE
Glucose, UA: NEGATIVE
Leukocytes, UA: NEGATIVE
Nitrite, UA: NEGATIVE
Protein, UA: NEGATIVE
Spec Grav, UA: 1.015
UROBILINOGEN UA: 0.2
pH, UA: 5.5

## 2014-06-21 MED ORDER — METHYLPREDNISOLONE 4 MG PO KIT: PACK | ORAL | Status: AC

## 2014-06-21 MED ORDER — CYCLOBENZAPRINE HCL 10 MG PO TABS: 10.0000 mg | ORAL_TABLET | Freq: Three times a day (TID) | ORAL | Status: DC | PRN

## 2014-06-21 MED ORDER — HYDROCODONE-ACETAMINOPHEN 10-325 MG PO TABS: 1.0000 | ORAL_TABLET | Freq: Four times a day (QID) | ORAL | Status: DC | PRN

## 2014-06-21 NOTE — Progress Notes (Signed)
Pre visit review using our clinic review tool, if applicable. No additional management support is needed unless otherwise documented below in the visit note. 

## 2014-06-21 NOTE — Progress Notes (Signed)
   Subjective:    Patient ID: Jillian [name], female    DOB: August 21, 1970, 43 y.o.   MRN: [id number]  HPI Here for the sudden onset last night of severe pain in the middle back. No recent trauma but she has a hx of disc problems in the lower spine. She has been nauseated but has not vomited. No urinary urgency or burning. No fever. Her BMs are normal. She has an IUD and has not had any menses for over a year. She had a kidney infection many years ago and this reminds her of what that felt like.    Review of Systems  Constitutional: Negative.   Respiratory: Negative.   Cardiovascular: Negative.   Gastrointestinal: Positive for nausea. Negative for vomiting, abdominal pain, diarrhea, constipation, blood in stool, abdominal distention, anal bleeding and rectal pain.  Genitourinary: Negative.   Musculoskeletal: Positive for back pain.       Objective:   Physical Exam  Constitutional:  In pain, walks slowly   Cardiovascular: Normal rate, regular rhythm, normal heart sounds and intact distal pulses.   Pulmonary/Chest: Effort normal and breath sounds normal.  Abdominal: Soft. Bowel sounds are normal. She exhibits no distension and no mass. There is no tenderness. There is no rebound and no guarding.  Musculoskeletal:  Very tender over the spine in the lower thoracic region, some spasm, ROM is full           Assessment & Plan:  This is consistent with a herniated disc in the thoracic spine. She will rest and use heat. Try a Medrol dose pack, Flexeril and Norco. I do not think she has a UTI but we will culture the urine sample to be sure

## 2014-06-22 ENCOUNTER — Ambulatory Visit: Payer: 59 | Admitting: Family Medicine

## 2014-06-22 LAB — URINE CULTURE
Colony Count: NO GROWTH
Organism ID, Bacteria: NO GROWTH

## 2015-05-29 ENCOUNTER — Other Ambulatory Visit: Payer: Self-pay

## 2015-05-29 DIAGNOSIS — Z1231 Encounter for screening mammogram for malignant neoplasm of breast: Secondary | ICD-10-CM

## 2015-06-26 ENCOUNTER — Ambulatory Visit: Payer: Self-pay

## 2015-07-23 ENCOUNTER — Ambulatory Visit
Admission: RE | Admit: 2015-07-23 | Discharge: 2015-07-23 | Disposition: A | Discharge status: Home / Self Care | Payer: 59 | Source: Ambulatory Visit

## 2015-07-23 DIAGNOSIS — Z1231 Encounter for screening mammogram for malignant neoplasm of breast: Secondary | ICD-10-CM

## 2015-07-24 ENCOUNTER — Other Ambulatory Visit: Payer: Self-pay | Admitting: Obstetrics and Gynecology

## 2015-07-24 DIAGNOSIS — R928 Other abnormal and inconclusive findings on diagnostic imaging of breast: Secondary | ICD-10-CM

## 2015-07-27 ENCOUNTER — Ambulatory Visit
Admission: RE | Admit: 2015-07-27 | Discharge: 2015-07-27 | Disposition: A | Discharge status: Home / Self Care | Payer: 59 | Source: Ambulatory Visit | Attending: Obstetrics and Gynecology | Admitting: Obstetrics and Gynecology

## 2015-07-27 DIAGNOSIS — R928 Other abnormal and inconclusive findings on diagnostic imaging of breast: Secondary | ICD-10-CM

## 2015-10-25 ENCOUNTER — Other Ambulatory Visit (INDEPENDENT_AMBULATORY_CARE_PROVIDER_SITE_OTHER): Payer: 59

## 2015-10-25 ENCOUNTER — Ambulatory Visit (INDEPENDENT_AMBULATORY_CARE_PROVIDER_SITE_OTHER): Payer: 59 | Admitting: Internal Medicine

## 2015-10-25 ENCOUNTER — Encounter: Payer: Self-pay | Admitting: Internal Medicine

## 2015-10-25 VITALS — BP 110/60 | HR 84 | Temp 98.5°F | Resp 12 | Ht 67.0 in | Wt 158.0 lb

## 2015-10-25 DIAGNOSIS — E785 Hyperlipidemia, unspecified: Secondary | ICD-10-CM | POA: Diagnosis not present

## 2015-10-25 DIAGNOSIS — Z0000 Encounter for general adult medical examination without abnormal findings: Secondary | ICD-10-CM

## 2015-10-25 DIAGNOSIS — Z7189 Other specified counseling: Secondary | ICD-10-CM | POA: Diagnosis not present

## 2015-10-25 DIAGNOSIS — Z7184 Encounter for health counseling related to travel: Secondary | ICD-10-CM

## 2015-10-25 DIAGNOSIS — Z418 Encounter for other procedures for purposes other than remedying health state: Secondary | ICD-10-CM | POA: Diagnosis not present

## 2015-10-25 DIAGNOSIS — Z23 Encounter for immunization: Secondary | ICD-10-CM | POA: Diagnosis not present

## 2015-10-25 DIAGNOSIS — Z299 Encounter for prophylactic measures, unspecified: Secondary | ICD-10-CM

## 2015-10-25 LAB — CBC
HCT: 42 % (ref 36.0–46.0)
HEMOGLOBIN: 14.5 g/dL (ref 12.0–15.0)
MCHC: 34.5 g/dL (ref 30.0–36.0)
MCV: 85.2 fl (ref 78.0–100.0)
PLATELETS: 235 10*3/uL (ref 150.0–400.0)
RBC: 4.92 Mil/uL (ref 3.87–5.11)
RDW: 13.5 % (ref 11.5–15.5)
WBC: 8.1 10*3/uL (ref 4.0–10.5)

## 2015-10-25 LAB — COMPREHENSIVE METABOLIC PANEL
ALT: 20 U/L (ref 0–35)
AST: 21 U/L (ref 0–37)
Albumin: 4.7 g/dL (ref 3.5–5.2)
Alkaline Phosphatase: 74 U/L (ref 39–117)
BUN: 11 mg/dL (ref 6–23)
CALCIUM: 9.4 mg/dL (ref 8.4–10.5)
CHLORIDE: 105 meq/L (ref 96–112)
CO2: 28 meq/L (ref 19–32)
Creatinine, Ser: 0.79 mg/dL (ref 0.40–1.20)
GFR: 83.15 mL/min (ref >60.00)
GLUCOSE: 103 mg/dL — AB (ref 70–99)
Potassium: 4.7 mEq/L (ref 3.5–5.1)
Sodium: 139 mEq/L (ref 135–145)
Total Bilirubin: 0.6 mg/dL (ref 0.2–1.2)
Total Protein: 7.5 g/dL (ref 6.0–8.3)

## 2015-10-25 LAB — LIPID PANEL
CHOL/HDL RATIO: 5
Cholesterol: 211 mg/dL — ABNORMAL HIGH (ref 0–200)
HDL: 44.4 mg/dL (ref >39.00)
LDL CALC: 145 mg/dL — AB (ref 0–99)
NONHDL: 166.41
TRIGLYCERIDES: 107 mg/dL (ref 0.0–149.0)
VLDL: 21.4 mg/dL (ref 0.0–40.0)

## 2015-10-25 MED ORDER — CYCLOBENZAPRINE HCL 10 MG PO TABS: 10.0000 mg | ORAL_TABLET | Freq: Three times a day (TID) | ORAL | Status: DC | PRN

## 2015-10-25 MED ORDER — TYPHOID VACCINE PO CPDR: 1.0000 | DELAYED_RELEASE_CAPSULE | ORAL | Status: DC

## 2015-10-25 MED ORDER — CIPROFLOXACIN HCL 500 MG PO TABS: 500.0000 mg | ORAL_TABLET | Freq: Two times a day (BID) | ORAL | Status: DC

## 2015-10-25 NOTE — Progress Notes (Signed)
   Subjective:    Patient ID: Jillian [name], female    DOB: [birth year], 45 y.o.   MRN: [id number]  HPI The patient is a 45 YO female coming in for wellness. Going on a trip to [place] with her family in the next 1-2 weeks. Has never had any travel immunizations that she knows of although she has traveled quite frequently. No new concerns, still suffering with chronic back pain. Does yoga to help but uses flexeril occasionally for pain.   Review of Systems  Constitutional: Negative for fever, activity change, appetite change, fatigue and unexpected weight change.  HENT: Negative.   Eyes: Negative.   Respiratory: Negative for cough, shortness of breath and wheezing.   Cardiovascular: Negative for chest pain, palpitations and leg swelling.  Gastrointestinal: Negative for nausea, abdominal pain, diarrhea, constipation and abdominal distention.  Musculoskeletal: Positive for back pain and arthralgias. Negative for myalgias and joint swelling.  Skin: Negative.   Neurological: Negative.   Psychiatric/Behavioral: Negative.       Objective:   Physical Exam  Constitutional: She is oriented to person, place, and time. She appears well-developed and well-nourished.  HENT:  Head: Normocephalic and atraumatic.  Eyes: EOM are normal.  Neck: Normal range of motion.  Cardiovascular: Normal rate and regular rhythm.   Pulmonary/Chest: Effort normal and breath sounds normal. No respiratory distress. She has no wheezes. She has no rales.  Abdominal: Soft. Bowel sounds are normal. She exhibits no distension. There is no tenderness. There is no rebound.  Musculoskeletal: She exhibits no edema.  Neurological: She is alert and oriented to person, place, and time.  Skin: Skin is warm and dry.  Psychiatric: She has a normal mood and affect.   Filed Vitals:   10/25/15 1110  BP: 110/60  Pulse: 84  Temp: 98.5 F (36.9 C)  TempSrc: Oral  Resp: 12  Height: 5\' 7"  (1.702 m)  Weight: 158 lb (71.668  kg)  SpO2: 98%      Assessment & Plan:  Hepatitis A given at visit

## 2015-10-25 NOTE — Progress Notes (Signed)
Pre visit review using our clinic review tool, if applicable. No additional management support is needed unless otherwise documented below in the visit note. 

## 2015-10-25 NOTE — Assessment & Plan Note (Signed)
Hepatitis A given at visit and advised that this may not be effective given that their trip is so soon. Typhoid oral rx sent to pharmacy as they are adventurous eaters. Recommendations about vika, mosquito prevention given at visit. Given info from [facility] and the website for packing lists. Rx for ciprofloxacin in case of infection while traveling.

## 2015-10-25 NOTE — Assessment & Plan Note (Signed)
Checking lipid panel and adjust as needed.  

## 2015-10-25 NOTE — Assessment & Plan Note (Signed)
Checking labs and adjust as needed. Given screening recommendations. Going to gyn tomorrow for her pap smear. Counseled on health screening and given recommendations. No indication for early colon cancer screening.

## 2015-10-25 NOTE — Patient Instructions (Signed)
We would like you to start taking the typhoid oral vaccine Friday the 24th. Take 1 pill on Friday, 1 pill on Sunday, 1 pill on Tuesday, then the last pill on Thursday (March 30th).  We have given you the hepatitis A shot today and you should come get the second one in 6 months to guarantee lifetime protection.   We have sent in a ciprofloxacin in case you get traveler's diarrhea on your trip. If you start having diarrhea take 1 pill twice daily for 3 days.   Be sure to avoid mosquitos if possible as they are a cause for disease and can cause many different diseases.   Try to avoid local water as other countries do not have the same standards for cleanliness as you are accustomed to here.   The CDC has a website with travel suggestions based on Country you are traveling to so you can know what things to take with you and things to avoid. https://hall-bush.com/  Health Maintenance, Female Adopting a healthy lifestyle and getting preventive care can go a long way to promote health and wellness. Talk with your health care provider about what schedule of regular examinations is right for you. This is a good chance for you to check in with your provider about disease prevention and staying healthy. In between checkups, there are plenty of things you can do on your own. Experts have done a lot of research about which lifestyle changes and preventive measures are most likely to keep you healthy. Ask your health care provider for more information. WEIGHT AND DIET  Eat a healthy diet  Be sure to include plenty of vegetables, fruits, low-fat dairy products, and lean protein.  Do not eat a lot of foods high in solid fats, added sugars, or salt.  Get regular exercise. This is one of the most important things you can do for your health.  Most adults should exercise for at least 150 minutes each week. The exercise should  increase your heart rate and make you sweat (moderate-intensity exercise).  Most adults should also do strengthening exercises at least twice a week. This is in addition to the moderate-intensity exercise.  Maintain a healthy weight  Body mass index (BMI) is a measurement that can be used to identify possible weight problems. It estimates body fat based on height and weight. Your health care provider can help determine your BMI and help you achieve or maintain a healthy weight.  For females 41 years of age and older:   A BMI below 18.5 is considered underweight.  A BMI of 18.5 to 24.9 is normal.  A BMI of 25 to 29.9 is considered overweight.  A BMI of 30 and above is considered obese.  Watch levels of cholesterol and blood lipids  You should start having your blood tested for lipids and cholesterol at 47 years of age, then have this test every 5 years.  You may need to have your cholesterol levels checked more often if:  Your lipid or cholesterol levels are high.  You are older than 47 years of age.  You are at high risk for heart disease.  CANCER SCREENING   Lung Cancer  Lung cancer screening is recommended for adults 83-53 years old who are at high risk for lung cancer because of a history of smoking.  A yearly low-dose CT scan of the lungs is recommended for people who:  Currently smoke.  Have quit within the past 15 years.  Have at least  a 30-pack-year history of smoking. A pack year is smoking an average of one pack of cigarettes a day for 1 year.  Yearly screening should continue until it has been 15 years since you quit.  Yearly screening should stop if you develop a health problem that would prevent you from having lung cancer treatment.  Breast Cancer  Practice breast self-awareness. This means understanding how your breasts normally appear and feel.  It also means doing regular breast self-exams. Let your health care provider know about any changes, no  matter how small.  If you are in your 20s or 30s, you should have a clinical breast exam (CBE) by a health care provider every 1-3 years as part of a regular health exam.  If you are 58 or older, have a CBE every year. Also consider having a breast X-ray (mammogram) every year.  If you have a family history of breast cancer, talk to your health care provider about genetic screening.  If you are at high risk for breast cancer, talk to your health care provider about having an MRI and a mammogram every year.  Breast cancer gene (BRCA) assessment is recommended for women who have family members with BRCA-related cancers. BRCA-related cancers include:  Breast.  Ovarian.  Tubal.  Peritoneal cancers.  Results of the assessment will determine the need for genetic counseling and BRCA1 and BRCA2 testing. Cervical Cancer Your health care provider may recommend that you be screened regularly for cancer of the pelvic organs (ovaries, uterus, and vagina). This screening involves a pelvic examination, including checking for microscopic changes to the surface of your cervix (Pap test). You may be encouraged to have this screening done every 3 years, beginning at age 36.  For women ages 69-65, health care providers may recommend pelvic exams and Pap testing every 3 years, or they may recommend the Pap and pelvic exam, combined with testing for human papilloma virus (HPV), every 5 years. Some types of HPV increase your risk of cervical cancer. Testing for HPV may also be done on women of any age with unclear Pap test results.  Other health care providers may not recommend any screening for nonpregnant women who are considered low risk for pelvic cancer and who do not have symptoms. Ask your health care provider if a screening pelvic exam is right for you.  If you have had past treatment for cervical cancer or a condition that could lead to cancer, you need Pap tests and screening for cancer for at least  20 years after your treatment. If Pap tests have been discontinued, your risk factors (such as having a new sexual partner) need to be reassessed to determine if screening should resume. Some women have medical problems that increase the chance of getting cervical cancer. In these cases, your health care provider may recommend more frequent screening and Pap tests. Colorectal Cancer  This type of cancer can be detected and often prevented.  Routine colorectal cancer screening usually begins at 47 years of age and continues through 47 years of age.  Your health care provider may recommend screening at an earlier age if you have risk factors for colon cancer.  Your health care provider may also recommend using home test kits to check for hidden blood in the stool.  A small camera at the end of a tube can be used to examine your colon directly (sigmoidoscopy or colonoscopy). This is done to check for the earliest forms of colorectal cancer.  Routine screening usually  begins at age 78.  Direct examination of the colon should be repeated every 5-10 years through 47 years of age. However, you may need to be screened more often if early forms of precancerous polyps or small growths are found. Skin Cancer  Check your skin from head to toe regularly.  Tell your health care provider about any new moles or changes in moles, especially if there is a change in a mole's shape or color.  Also tell your health care provider if you have a mole that is larger than the size of a pencil eraser.  Always use sunscreen. Apply sunscreen liberally and repeatedly throughout the day.  Protect yourself by wearing long sleeves, pants, a wide-brimmed hat, and sunglasses whenever you are outside. HEART DISEASE, DIABETES, AND HIGH BLOOD PRESSURE   High blood pressure causes heart disease and increases the risk of stroke. High blood pressure is more likely to develop in:  People who have blood pressure in the high end  of the normal range (130-139/85-89 mm Hg).  People who are overweight or obese.  People who are African American.  If you are 6-50 years of age, have your blood pressure checked every 3-5 years. If you are 64 years of age or older, have your blood pressure checked every year. You should have your blood pressure measured twice--once when you are at a hospital or clinic, and once when you are not at a hospital or clinic. Record the average of the two measurements. To check your blood pressure when you are not at a hospital or clinic, you can use:  An automated blood pressure machine at a pharmacy.  A home blood pressure monitor.  If you are between 27 years and 105 years old, ask your health care provider if you should take aspirin to prevent strokes.  Have regular diabetes screenings. This involves taking a blood sample to check your fasting blood sugar level.  If you are at a normal weight and have a low risk for diabetes, have this test once every three years after 47 years of age.  If you are overweight and have a high risk for diabetes, consider being tested at a younger age or more often. PREVENTING INFECTION  Hepatitis B  If you have a higher risk for hepatitis B, you should be screened for this virus. You are considered at high risk for hepatitis B if:  You were born in a country where hepatitis B is common. Ask your health care provider which countries are considered high risk.  Your parents were born in a high-risk country, and you have not been immunized against hepatitis B (hepatitis B vaccine).  You have HIV or AIDS.  You use needles to inject street drugs.  You live with someone who has hepatitis B.  You have had sex with someone who has hepatitis B.  You get hemodialysis treatment.  You take certain medicines for conditions, including cancer, organ transplantation, and autoimmune conditions. Hepatitis C  Blood testing is recommended for:  Everyone born from  [birth year] through 1967.  Anyone with known risk factors for hepatitis C. Sexually transmitted infections (STIs)  You should be screened for sexually transmitted infections (STIs) including gonorrhea and chlamydia if:  You are sexually active and are younger than 47 years of age.  You are older than 47 years of age and your health care provider tells you that you are at risk for this type of infection.  Your sexual activity has changed since you were  last screened and you are at an increased risk for chlamydia or gonorrhea. Ask your health care provider if you are at risk.  If you do not have HIV, but are at risk, it may be recommended that you take a prescription medicine daily to prevent HIV infection. This is called pre-exposure prophylaxis (PrEP). You are considered at risk if:  You are sexually active and do not regularly use condoms or know the HIV status of your partner(s).  You take drugs by injection.  You are sexually active with a partner who has HIV. Talk with your health care provider about whether you are at high risk of being infected with HIV. If you choose to begin PrEP, you should first be tested for HIV. You should then be tested every 3 months for as long as you are taking PrEP.  PREGNANCY   If you are premenopausal and you may become pregnant, ask your health care provider about preconception counseling.  If you may become pregnant, take 400 to 800 micrograms (mcg) of folic acid every day.  If you want to prevent pregnancy, talk to your health care provider about birth control (contraception). OSTEOPOROSIS AND MENOPAUSE   Osteoporosis is a disease in which the bones lose minerals and strength with aging. This can result in serious bone fractures. Your risk for osteoporosis can be identified using a bone density scan.  If you are 16 years of age or older, or if you are at risk for osteoporosis and fractures, ask your health care provider if you should be screened.  Ask  your health care provider whether you should take a calcium or vitamin D supplement to lower your risk for osteoporosis.  Menopause may have certain physical symptoms and risks.  Hormone replacement therapy may reduce some of these symptoms and risks. Talk to your health care provider about whether hormone replacement therapy is right for you.  HOME CARE INSTRUCTIONS   Schedule regular health, dental, and eye exams.  Stay current with your immunizations.   Do not use any tobacco products including cigarettes, chewing tobacco, or electronic cigarettes.  If you are pregnant, do not drink alcohol.  If you are breastfeeding, limit how much and how often you drink alcohol.  Limit alcohol intake to no more than 1 drink per day for nonpregnant women. One drink equals 12 ounces of beer, 5 ounces of wine, or 1 ounces of hard liquor.  Do not use street drugs.  Do not share needles.  Ask your health care provider for help if you need support or information about quitting drugs.  Tell your health care provider if you often feel depressed.  Tell your health care provider if you have ever been abused or do not feel safe at home.   This information is not intended to replace advice given to you by your health care provider. Make sure you discuss any questions you have with your health care provider.   Document Released: 02/03/2011 Document Revised: 08/11/2014 Document Reviewed: 06/22/2013 Elsevier Interactive Patient Education [employer].

## 2016-07-10 ENCOUNTER — Other Ambulatory Visit: Payer: Self-pay | Admitting: Obstetrics and Gynecology

## 2016-07-10 DIAGNOSIS — Z1231 Encounter for screening mammogram for malignant neoplasm of breast: Secondary | ICD-10-CM

## 2016-08-11 ENCOUNTER — Ambulatory Visit
Admission: RE | Admit: 2016-08-11 | Discharge: 2016-08-11 | Disposition: A | Discharge status: Home / Self Care | Payer: 59 | Source: Ambulatory Visit | Attending: Obstetrics and Gynecology | Admitting: Obstetrics and Gynecology

## 2016-08-11 DIAGNOSIS — Z1231 Encounter for screening mammogram for malignant neoplasm of breast: Secondary | ICD-10-CM

## 2016-08-15 ENCOUNTER — Ambulatory Visit: Payer: 59

## 2017-03-26 ENCOUNTER — Emergency Department (HOSPITAL_COMMUNITY): Payer: 59

## 2017-03-26 ENCOUNTER — Emergency Department (HOSPITAL_COMMUNITY)
Admission: EM | Admit: 2017-03-26 | Discharge: 2017-03-26 | Disposition: A | Discharge status: Home / Self Care | Payer: 59 | Attending: Emergency Medicine | Admitting: Emergency Medicine

## 2017-03-26 ENCOUNTER — Encounter (HOSPITAL_COMMUNITY): Payer: Self-pay | Admitting: Emergency Medicine

## 2017-03-26 DIAGNOSIS — N201 Calculus of ureter: Secondary | ICD-10-CM | POA: Insufficient documentation

## 2017-03-26 DIAGNOSIS — N23 Unspecified renal colic: Secondary | ICD-10-CM

## 2017-03-26 DIAGNOSIS — N83201 Unspecified ovarian cyst, right side: Secondary | ICD-10-CM | POA: Diagnosis not present

## 2017-03-26 DIAGNOSIS — R1032 Left lower quadrant pain: Secondary | ICD-10-CM | POA: Diagnosis present

## 2017-03-26 DIAGNOSIS — N200 Calculus of kidney: Secondary | ICD-10-CM

## 2017-03-26 DIAGNOSIS — N130 Hydronephrosis with ureteropelvic junction obstruction: Secondary | ICD-10-CM | POA: Insufficient documentation

## 2017-03-26 DIAGNOSIS — N134 Hydroureter: Secondary | ICD-10-CM | POA: Diagnosis not present

## 2017-03-26 HISTORY — DX: Disorder of kidney and ureter, unspecified: N28.9

## 2017-03-26 LAB — BASIC METABOLIC PANEL
Anion gap: 14 (ref 5–15)
BUN: 13 mg/dL (ref 6–20)
CHLORIDE: 107 mmol/L (ref 101–111)
CO2: 17 mmol/L — AB (ref 22–32)
CREATININE: 0.77 mg/dL (ref 0.44–1.00)
Calcium: 9.1 mg/dL (ref 8.9–10.3)
GFR calc Af Amer: >60 mL/min (ref >60)
GFR calc non Af Amer: >60 mL/min (ref >60)
Glucose, Bld: 150 mg/dL — ABNORMAL HIGH (ref 65–99)
Potassium: 3.7 mmol/L (ref 3.5–5.1)
SODIUM: 138 mmol/L (ref 135–145)

## 2017-03-26 LAB — URINALYSIS, ROUTINE W REFLEX MICROSCOPIC
Bacteria, UA: NONE SEEN
Bilirubin Urine: NEGATIVE
GLUCOSE, UA: NEGATIVE mg/dL
KETONES UR: 20 mg/dL — AB
Leukocytes, UA: NEGATIVE
Nitrite: NEGATIVE
Protein, ur: NEGATIVE mg/dL
Specific Gravity, Urine: 1.012 (ref 1.005–1.030)
pH: 6 (ref 5.0–8.0)

## 2017-03-26 LAB — CBC
HCT: 41.1 % (ref 36.0–46.0)
Hemoglobin: 14.2 g/dL (ref 12.0–15.0)
MCH: 29.5 pg (ref 26.0–34.0)
MCHC: 34.5 g/dL (ref 30.0–36.0)
MCV: 85.4 fL (ref 78.0–100.0)
PLATELETS: 240 10*3/uL (ref 150–400)
RBC: 4.81 MIL/uL (ref 3.87–5.11)
RDW: 13.4 % (ref 11.5–15.5)
WBC: 12.3 10*3/uL — ABNORMAL HIGH (ref 4.0–10.5)

## 2017-03-26 LAB — I-STAT BETA HCG BLOOD, ED (MC, WL, AP ONLY): I-stat hCG, quantitative: <5 m[IU]/mL (ref <5)

## 2017-03-26 MED ORDER — PROMETHAZINE HCL 25 MG/ML IJ SOLN
12.5000 mg | Freq: Once | INTRAMUSCULAR | Status: AC
  Administered 2017-03-26: 12.5 mg via INTRAVENOUS
  Filled 2017-03-26: qty 1

## 2017-03-26 MED ORDER — HYDROMORPHONE HCL 1 MG/ML IJ SOLN
1.0000 mg | Freq: Once | INTRAMUSCULAR | Status: AC
  Administered 2017-03-26: 1 mg via INTRAVENOUS
  Filled 2017-03-26: qty 1

## 2017-03-26 MED ORDER — FENTANYL CITRATE (PF) 100 MCG/2ML IJ SOLN
50.0000 ug | INTRAMUSCULAR | Status: DC | PRN
  Administered 2017-03-26: 50 ug via INTRAVENOUS

## 2017-03-26 MED ORDER — IBUPROFEN 600 MG PO TABS: 600.0000 mg | ORAL_TABLET | Freq: Four times a day (QID) | ORAL | 0 refills | Status: AC | PRN

## 2017-03-26 MED ORDER — ONDANSETRON 4 MG PO TBDP: 4.0000 mg | ORAL_TABLET | Freq: Three times a day (TID) | ORAL | 0 refills | Status: DC | PRN

## 2017-03-26 MED ORDER — KETOROLAC TROMETHAMINE 15 MG/ML IJ SOLN
15.0000 mg | Freq: Once | INTRAMUSCULAR | Status: AC
  Administered 2017-03-26: 15 mg via INTRAVENOUS
  Filled 2017-03-26: qty 1

## 2017-03-26 MED ORDER — SODIUM CHLORIDE 0.9 % IV BOLUS (SEPSIS)
1000.0000 mL | Freq: Once | INTRAVENOUS | Status: AC
  Administered 2017-03-26: 1000 mL via INTRAVENOUS

## 2017-03-26 MED ORDER — FENTANYL CITRATE (PF) 100 MCG/2ML IJ SOLN
50.0000 ug | INTRAMUSCULAR | Status: DC | PRN
  Filled 2017-03-26: qty 2

## 2017-03-26 MED ORDER — ONDANSETRON HCL 4 MG/2ML IJ SOLN: 4.0000 mg | Freq: Once | INTRAMUSCULAR | Status: DC

## 2017-03-26 MED ORDER — ONDANSETRON HCL 4 MG/2ML IJ SOLN
4.0000 mg | Freq: Once | INTRAMUSCULAR | Status: AC
  Administered 2017-03-26: 4 mg via INTRAVENOUS
  Filled 2017-03-26: qty 2

## 2017-03-26 MED ORDER — OXYCODONE-ACETAMINOPHEN 5-325 MG PO TABS: 1.0000 | ORAL_TABLET | ORAL | 0 refills | Status: DC | PRN

## 2017-03-26 MED ORDER — MORPHINE SULFATE (PF) 2 MG/ML IV SOLN
4.0000 mg | Freq: Once | INTRAVENOUS | Status: AC
  Administered 2017-03-26: 4 mg via INTRAVENOUS
  Filled 2017-03-26: qty 2

## 2017-03-26 NOTE — ED Triage Notes (Signed)
Per patient, states lower back pain that started this am-states she has back pain that mimics kidney stones-states history of the same but not for years-states N/V

## 2017-03-26 NOTE — ED Provider Notes (Signed)
[facility] DEPT Provider Note   CSN: [id number] Arrival date & time: 03/26/17  0903     History   Chief Complaint Chief Complaint  Patient presents with  . Back Pain    HPI Jillian [name] is a 46 y.o. female.  HPI   46 yo F here with acute onset flank pain. Pt reports that around 4-5 AM this morning, she experienced acute onset of left flank pain. Pain is aching ,throbbing, and severe. It seems to come and go in waves, though there is pain always at rest. Pain is worse palpation and certain positions. She has had some associated urinary urgency and sensation she needs to go to the bathroom. She has had nausea/vomiting as well. No diarrhea or constipation. No fevers. She does have a h/o kidney stones. Also has h/o chronic back pain, and ovarian cysts/pain.  Past Medical History:  Diagnosis Date  . Hyperlipidemia   . Renal disorder     Patient Active Problem List   Diagnosis Date Noted  . Travel advice encounter 10/25/2015  . Routine general medical examination at a health care facility 10/25/2015  . Dyslipidemia 08/21/2010    Past Surgical History:  Procedure Laterality Date  . APPENDECTOMY      OB History    Gravida Para Term Preterm AB Living   5       1 4    SAB TAB Ectopic Multiple Live Births   1               Home Medications    Prior to Admission medications   Medication Sig Start Date End Date Taking? Authorizing Provider  Multiple Vitamin (MULTIVITAMIN WITH MINERALS) TABS tablet Take 1 tablet by mouth daily.   Yes [provider]  ciprofloxacin (CIPRO) 500 MG tablet Take 1 tablet (500 mg total) by mouth 2 (two) times daily. Patient not taking: Reported on 03/26/2017 10/25/15   Hoyt Koch, MD  cyclobenzaprine (FLEXERIL) 10 MG tablet Take 1 tablet (10 mg total) by mouth 3 (three) times daily as needed for muscle spasms. Patient not taking: Reported on 03/26/2017 10/25/15   Hoyt Koch, MD  HYDROcodone-acetaminophen  [facility]) 10-325 MG per tablet Take 1 tablet by mouth every 6 (six) hours as needed for moderate pain. Patient not taking: Reported on 03/26/2017 06/21/14   Laurey Morale, MD  ibuprofen (ADVIL,MOTRIN) 600 MG tablet Take 1 tablet (600 mg total) by mouth every 6 (six) hours as needed for mild pain. 03/26/17   Duffy Bruce, MD  ondansetron (ZOFRAN ODT) 4 MG disintegrating tablet Take 1 tablet (4 mg total) by mouth every 8 (eight) hours as needed for nausea or vomiting. 03/26/17   Duffy Bruce, MD  oxyCODONE-acetaminophen (PERCOCET/ROXICET) 5-325 MG tablet Take 1-2 tablets by mouth every 4 (four) hours as needed for severe pain. 03/26/17   Duffy Bruce, MD  typhoid (VIVOTIF) DR capsule Take 1 capsule by mouth every other day. Patient not taking: Reported on 03/26/2017 10/25/15   Hoyt Koch, MD    Family History Family History  Problem Relation Age of Onset  . Heart disease Mother     Social History Social History  Substance Use Topics  . Smoking status: Never Smoker  . Smokeless tobacco: Never Used  . Alcohol use No     Allergies   Ultram [tramadol hcl]   Review of Systems Review of Systems  Constitutional: Positive for fatigue. Negative for chills and fever.  HENT: Negative for congestion, rhinorrhea and sore  throat.   Eyes: Negative for visual disturbance.  Respiratory: Negative for cough, shortness of breath and wheezing.   Cardiovascular: Negative for chest pain and leg swelling.  Gastrointestinal: Positive for abdominal pain, nausea and vomiting. Negative for diarrhea.  Genitourinary: Positive for flank pain and urgency. Negative for dysuria, vaginal bleeding and vaginal discharge.  Musculoskeletal: Negative for neck pain.  Skin: Negative for rash.  Allergic/Immunologic: Negative for immunocompromised state.  Neurological: Negative for syncope and headaches.  Hematological: Does not bruise/bleed easily.  All other systems reviewed and are  negative.    Physical Exam Updated Vital Signs BP 104/72 (BP Location: Left Arm)   Pulse 61   Resp 18   LMP 03/12/2017   SpO2 100%   Physical Exam  Constitutional: She is oriented to person, place, and time. She appears well-developed and well-nourished. She appears distressed.  HENT:  Head: Normocephalic and atraumatic.  Eyes: Conjunctivae are normal.  Mildly dry MM  Neck: Neck supple.  Cardiovascular: Normal rate, regular rhythm and normal heart sounds.  Exam reveals no friction rub.   No murmur heard. Pulmonary/Chest: Effort normal and breath sounds normal. No respiratory distress. She has no wheezes. She has no rales.  Abdominal: Soft. She exhibits no distension. There is tenderness (mild, throughout left hemiabdomen). There is no rebound and no guarding.  No overt CVAT  Musculoskeletal: She exhibits no edema.  Neurological: She is alert and oriented to person, place, and time. She exhibits normal muscle tone.  Skin: Skin is warm. Capillary refill takes less than 2 seconds.  Psychiatric: She has a normal mood and affect.  Nursing note and vitals reviewed.    ED Treatments / Results  Labs (all labs ordered are listed, but only abnormal results are displayed) Labs Reviewed  URINALYSIS, ROUTINE W REFLEX MICROSCOPIC - Abnormal; Notable for the following:       Result Value   Hgb urine dipstick SMALL (*)    Ketones, ur 20 (*)    Squamous Epithelial / LPF 0-5 (*)    All other components within normal limits  BASIC METABOLIC PANEL - Abnormal; Notable for the following:    CO2 17 (*)    Glucose, Bld 150 (*)    All other components within normal limits  CBC - Abnormal; Notable for the following:    WBC 12.3 (*)    All other components within normal limits  I-STAT BETA HCG BLOOD, ED ([facility], [facility], [facility] ONLY)    EKG  EKG Interpretation None       Radiology Ct Renal Stone Study  Result Date: 03/26/2017 CLINICAL DATA:  Flank pain with recurrent stone suspected. EXAM: CT  ABDOMEN AND PELVIS WITHOUT CONTRAST TECHNIQUE: Multidetector CT imaging of the abdomen and pelvis was performed following the standard protocol without IV contrast. COMPARISON:  None. FINDINGS: Lower chest:  Negative Hepatobiliary: No focal liver abnormality.No evidence of biliary obstruction or stone. Pancreas: Unremarkable. Spleen: Unremarkable. Adrenals/Urinary Tract: Negative adrenals. 3 mm left UVJ calculus with moderate hydroureteronephrosis. Right pelviectasis and mild hydroureter without visible obstructive process. Punctate left lower pole renal calculus. Unremarkable bladder. Stomach/Bowel:  No obstruction. Appendectomy. Vascular/Lymphatic: No acute vascular abnormality. No mass or adenopathy. Reproductive:Cystic structure in the right adnexa with septation or fold, measuring up to 6 cm. IUD in good position. Other: No ascites or pneumoperitoneum. Musculoskeletal: No acute osseous finding. T10-11 left paracentral disc protrusion which may contact the cord. Advanced disc degeneration at L1-2 and L4-5. L1-2. Superior endplate ridging spinal stenosis. Moderate spinal stenosis at  L4-5. IMPRESSION: 1. Left hydroureteronephrosis from 3 mm UVJ calculus. 2. Right pelviectasis and borderline hydroureter without visible obstructive process. 3. Punctate left nephrolithiasis. 4. Right ovarian septated cyst versus hydrosalpinx measuring up to 6 cm. Recommend gynecology follow-up for pelvic ultrasound. 5. Spinal degeneration as described. Moderate spinal stenosis at L4-5. Electronically Signed   By: Monte Fantasia M.D.   On: 03/26/2017 10:55    Procedures Procedures (including critical care time)  Medications Ordered in ED Medications  ondansetron (ZOFRAN) injection 4 mg (4 mg Intravenous Given 03/26/17 0930)  HYDROmorphone (DILAUDID) injection 1 mg (1 mg Intravenous Given 03/26/17 1011)  sodium chloride 0.9 % bolus 1,000 mL (0 mLs Intravenous Stopped 03/26/17 1201)  promethazine (PHENERGAN) injection 12.5 mg  (12.5 mg Intravenous Given 03/26/17 1011)  ketorolac (TORADOL) 15 MG/ML injection 15 mg (15 mg Intravenous Given 03/26/17 1208)  morphine 2 MG/ML injection 4 mg (4 mg Intravenous Given 03/26/17 1304)     Initial Impression / Assessment and Plan / ED Course  I have reviewed the triage vital signs and the nursing notes.  Pertinent labs & imaging results that were available during my care of the patient were reviewed by me and considered in my medical decision making (see chart for details).     46 yo F here with acute onset left flank pain with urinary urgency. UA shows no signs of infection. Lab work is c/w dehydration without significant AKI. She has been given fluids. Will check stone study. No vaginal bleeding, discharge, or s/s to suggest uterine/ovarian pathology.   CT scan shows left hydro 2/2 3 mm stone at UVJ, likely explaining her pain. She also has incidental right ovarian cyst - she states she has been told this, will refer for outpt OBGYN ultrasound. Otherwise, pain is markedly improved after fluids, analgesia. She is now tolerating PO, well appearing. Will tx with analgesia, antiemetics, outpt Urology and OBGYN follow-up.  Final Clinical Impressions(s) / ED Diagnoses   Final diagnoses:  Renal colic  Nephrolithiasis  Right ovarian cyst    New Prescriptions Discharge Medication List as of 03/26/2017  1:46 PM    START taking these medications   Details  ondansetron (ZOFRAN ODT) 4 MG disintegrating tablet Take 1 tablet (4 mg total) by mouth every 8 (eight) hours as needed for nausea or vomiting., Starting Thu 03/26/2017, Print    oxyCODONE-acetaminophen (PERCOCET/ROXICET) 5-325 MG tablet Take 1-2 tablets by mouth every 4 (four) hours as needed for severe pain., Starting Thu 03/26/2017, Print         Duffy Bruce, MD 03/26/17 [phone]

## 2018-03-24 ENCOUNTER — Other Ambulatory Visit: Payer: Self-pay | Admitting: Obstetrics and Gynecology

## 2018-03-24 DIAGNOSIS — Z1231 Encounter for screening mammogram for malignant neoplasm of breast: Secondary | ICD-10-CM

## 2018-04-27 ENCOUNTER — Ambulatory Visit
Admission: RE | Admit: 2018-04-27 | Discharge: 2018-04-27 | Disposition: A | Discharge status: Home / Self Care | Payer: 59 | Source: Ambulatory Visit | Attending: Obstetrics and Gynecology | Admitting: Obstetrics and Gynecology

## 2018-04-27 DIAGNOSIS — Z1231 Encounter for screening mammogram for malignant neoplasm of breast: Secondary | ICD-10-CM

## 2018-04-28 ENCOUNTER — Other Ambulatory Visit: Payer: Self-pay | Admitting: Obstetrics and Gynecology

## 2018-04-28 DIAGNOSIS — R928 Other abnormal and inconclusive findings on diagnostic imaging of breast: Secondary | ICD-10-CM

## 2018-04-29 ENCOUNTER — Other Ambulatory Visit: Payer: Self-pay | Admitting: Obstetrics and Gynecology

## 2018-04-29 ENCOUNTER — Other Ambulatory Visit: Payer: Self-pay

## 2018-04-29 DIAGNOSIS — R928 Other abnormal and inconclusive findings on diagnostic imaging of breast: Secondary | ICD-10-CM

## 2018-04-30 ENCOUNTER — Other Ambulatory Visit: Payer: Self-pay | Admitting: Obstetrics and Gynecology

## 2018-04-30 ENCOUNTER — Ambulatory Visit
Admission: RE | Admit: 2018-04-30 | Discharge: 2018-04-30 | Disposition: A | Discharge status: Home / Self Care | Payer: 59 | Source: Ambulatory Visit | Attending: Obstetrics and Gynecology | Admitting: Obstetrics and Gynecology

## 2018-04-30 DIAGNOSIS — N6320 Unspecified lump in the left breast, unspecified quadrant: Secondary | ICD-10-CM

## 2018-04-30 DIAGNOSIS — R928 Other abnormal and inconclusive findings on diagnostic imaging of breast: Secondary | ICD-10-CM

## 2018-11-01 ENCOUNTER — Other Ambulatory Visit: Payer: 59

## 2019-05-25 ENCOUNTER — Other Ambulatory Visit: Payer: Self-pay | Admitting: Obstetrics and Gynecology

## 2019-05-31 ENCOUNTER — Other Ambulatory Visit: Payer: Self-pay | Admitting: Obstetrics and Gynecology

## 2019-05-31 DIAGNOSIS — N6001 Solitary cyst of right breast: Secondary | ICD-10-CM

## 2019-06-08 ENCOUNTER — Other Ambulatory Visit: Payer: Self-pay

## 2019-06-08 ENCOUNTER — Ambulatory Visit
Admission: RE | Admit: 2019-06-08 | Discharge: 2019-06-08 | Disposition: A | Discharge status: Home / Self Care | Payer: 59 | Source: Ambulatory Visit | Attending: Obstetrics and Gynecology | Admitting: Obstetrics and Gynecology

## 2019-06-08 ENCOUNTER — Other Ambulatory Visit: Payer: Self-pay | Admitting: Obstetrics and Gynecology

## 2019-06-08 ENCOUNTER — Ambulatory Visit: Payer: 59

## 2019-06-08 DIAGNOSIS — N6001 Solitary cyst of right breast: Secondary | ICD-10-CM

## 2019-06-09 ENCOUNTER — Ambulatory Visit
Admission: RE | Admit: 2019-06-09 | Discharge: 2019-06-09 | Disposition: A | Discharge status: Home / Self Care | Payer: 59 | Source: Ambulatory Visit | Attending: Obstetrics and Gynecology | Admitting: Obstetrics and Gynecology

## 2019-06-09 ENCOUNTER — Other Ambulatory Visit: Payer: Self-pay | Admitting: Obstetrics and Gynecology

## 2019-06-09 DIAGNOSIS — N6001 Solitary cyst of right breast: Secondary | ICD-10-CM

## 2019-10-08 ENCOUNTER — Ambulatory Visit: Payer: 59 | Attending: Internal Medicine

## 2019-10-08 DIAGNOSIS — Z23 Encounter for immunization: Secondary | ICD-10-CM | POA: Insufficient documentation

## 2019-10-08 NOTE — Progress Notes (Signed)
   Covid-19 Vaccination Clinic  Name:  SADHANA CINTORA    MRN: [id number] DOB: Mar 15, 1971  10/08/2019  Ms. Pitney was observed post Covid-19 immunization for 15 minutes without incident. She was provided with Vaccine Information Sheet and instruction to access the V-Safe system.   Ms. Foltyn was instructed to call 911 with any severe reactions post vaccine: Marland Kitchen Difficulty breathing  . Swelling of face and throat  . A fast heartbeat  . A bad rash all over body  . Dizziness and weakness   Immunizations Administered    Name Date Dose VIS Date Route   Pfizer COVID-19 Vaccine 10/08/2019  2:23 PM 0.3 mL 07/15/2019 Intramuscular   Manufacturer: [place]   Lot: [id number]   [place]: [id number]

## 2019-10-29 ENCOUNTER — Ambulatory Visit: Payer: 59 | Attending: Internal Medicine

## 2019-10-29 DIAGNOSIS — Z23 Encounter for immunization: Secondary | ICD-10-CM

## 2019-10-29 NOTE — Progress Notes (Signed)
   Covid-19 Vaccination Clinic  Name:  Jillian [name]    MRN: [id number] DOB: [birth year]  10/29/2019  [name] was observed post Covid-19 immunization for 15 minutes without incident. She was provided with Vaccine Information Sheet and instruction to access the V-Safe system.   [name] was instructed to call 911 with any severe reactions post vaccine: Marland Kitchen Difficulty breathing  . Swelling of face and throat  . A fast heartbeat  . A bad rash all over body  . Dizziness and weakness   Immunizations Administered    Name Date Dose VIS Date Route   Pfizer COVID-19 Vaccine 10/29/2019 12:05 PM 0.3 mL 07/15/2019 Intramuscular   Manufacturer: [place]   Lot: [id number]   [place]: [id number]

## 2019-11-08 ENCOUNTER — Ambulatory Visit: Payer: 59

## 2020-08-09 ENCOUNTER — Other Ambulatory Visit: Payer: Self-pay | Admitting: Obstetrics and Gynecology

## 2020-08-09 DIAGNOSIS — Z1231 Encounter for screening mammogram for malignant neoplasm of breast: Secondary | ICD-10-CM

## 2020-08-16 ENCOUNTER — Encounter: Payer: Self-pay | Admitting: Gastroenterology

## 2020-09-05 ENCOUNTER — Other Ambulatory Visit: Payer: Self-pay

## 2020-09-05 ENCOUNTER — Ambulatory Visit (AMBULATORY_SURGERY_CENTER): Payer: Self-pay

## 2020-09-05 VITALS — Ht 66.0 in | Wt 182.0 lb

## 2020-09-05 DIAGNOSIS — Z1211 Encounter for screening for malignant neoplasm of colon: Secondary | ICD-10-CM

## 2020-09-05 NOTE — Progress Notes (Signed)
No allergies to soy or egg Pt is not on blood thinners or diet pills Denies issues with sedation/intubation Denies atrial flutter/fib Denies constipation   Pt is aware of Covid safety and care partner requirements.      

## 2020-09-18 ENCOUNTER — Ambulatory Visit
Admission: RE | Admit: 2020-09-18 | Discharge: 2020-09-18 | Disposition: A | Discharge status: Home / Self Care | Payer: BC Managed Care – PPO | Source: Ambulatory Visit | Attending: Obstetrics and Gynecology | Admitting: Obstetrics and Gynecology

## 2020-09-18 ENCOUNTER — Other Ambulatory Visit: Payer: Self-pay

## 2020-09-18 DIAGNOSIS — Z1231 Encounter for screening mammogram for malignant neoplasm of breast: Secondary | ICD-10-CM

## 2020-09-20 ENCOUNTER — Other Ambulatory Visit: Payer: Self-pay

## 2020-09-20 ENCOUNTER — Ambulatory Visit (AMBULATORY_SURGERY_CENTER): Payer: BC Managed Care – PPO | Admitting: Gastroenterology

## 2020-09-20 ENCOUNTER — Encounter: Payer: Self-pay | Admitting: Gastroenterology

## 2020-09-20 VITALS — BP 117/74 | HR 65 | Temp 98.4°F | Resp 15 | Ht 66.0 in | Wt 182.0 lb

## 2020-09-20 DIAGNOSIS — D125 Benign neoplasm of sigmoid colon: Secondary | ICD-10-CM

## 2020-09-20 DIAGNOSIS — Z1211 Encounter for screening for malignant neoplasm of colon: Secondary | ICD-10-CM | POA: Diagnosis present

## 2020-09-20 MED ORDER — SODIUM CHLORIDE 0.9 % IV SOLN: 500.0000 mL | Freq: Once | INTRAVENOUS | Status: DC

## 2020-09-20 NOTE — Progress Notes (Signed)
PT taken to PACU. Monitors in place. VSS. Report given to RN. 

## 2020-09-20 NOTE — Patient Instructions (Signed)
Information on polyps and diverticulosis given to you today.  Await pathology results.  Resume previous diet and medications.  Drink at least 64 oz of water a day and eat a high fiber diet.  YOU HAD AN ENDOSCOPIC PROCEDURE TODAY AT [facility]:   Refer to the procedure report that was given to you for any specific questions about what was found during the examination.  If the procedure report does not answer your questions, please call your gastroenterologist to clarify.  If you requested that your care partner not be given the details of your procedure findings, then the procedure report has been included in a sealed envelope for you to review at your convenience later.  YOU SHOULD EXPECT: Some feelings of bloating in the abdomen. Passage of more gas than usual.  Walking can help get rid of the air that was put into your GI tract during the procedure and reduce the bloating. If you had a lower endoscopy (such as a colonoscopy or flexible sigmoidoscopy) you may notice spotting of blood in your stool or on the toilet paper. If you underwent a bowel prep for your procedure, you may not have a normal bowel movement for a few days.  Please Note:  You might notice some irritation and congestion in your nose or some drainage.  This is from the oxygen used during your procedure.  There is no need for concern and it should clear up in a day or so.  SYMPTOMS TO REPORT IMMEDIATELY:   Following lower endoscopy (colonoscopy or flexible sigmoidoscopy):  Excessive amounts of blood in the stool  Significant tenderness or worsening of abdominal pains  Swelling of the abdomen that is new, acute  Fever of 100F or higher  For urgent or emergent issues, a gastroenterologist can be reached at any hour by calling [phone]. Do not use MyChart messaging for urgent concerns.    DIET:  We do recommend a small meal at first, but then you may proceed to your regular diet.  Drink plenty of  fluids but you should avoid alcoholic beverages for 24 hours.  ACTIVITY:  You should plan to take it easy for the rest of today and you should NOT DRIVE or use heavy machinery until tomorrow (because of the sedation medicines used during the test).    FOLLOW UP: Our staff will call the number listed on your records 48-72 hours following your procedure to check on you and address any questions or concerns that you may have regarding the information given to you following your procedure. If we do not reach you, we will leave a message.  We will attempt to reach you two times.  During this call, we will ask if you have developed any symptoms of COVID 19. If you develop any symptoms (ie: fever, flu-like symptoms, shortness of breath, cough etc.) before then, please call [phone].  If you test positive for Covid 19 in the 2 weeks post procedure, please call and report this information to [place].    If any biopsies were taken you will be contacted by phone or by letter within the next 1-3 weeks.  Please call us at [phone] if you have not heard about the biopsies in 3 weeks.    SIGNATURES/CONFIDENTIALITY: You and/or your care partner have signed paperwork which will be entered into your electronic medical record.  These signatures attest to the fact that that the information above on your After Visit Summary has been reviewed and is  understood.  Full responsibility of the confidentiality of this discharge information lies with you and/or your care-partner.

## 2020-09-20 NOTE — Progress Notes (Signed)
VS-CW  Pt's states no medical or surgical changes since previsit or office visit.  

## 2020-09-20 NOTE — Progress Notes (Signed)
Called to room to assist during endoscopic procedure.  Patient ID and intended procedure confirmed with present staff. Received instructions for my participation in the procedure from the performing physician.  

## 2020-09-20 NOTE — Op Note (Addendum)
Jillian [name] Patient Name: Jillian [name] Procedure Date: 09/20/2020 1:56 PM MRN: [id number] Endoscopist: Thornton Park MD, MD Age: 50 Referring MD:  Date of Birth: August 31, 1970 Gender: Female Account #: [id number] Procedure:                Colonoscopy Indications:              Screening for colorectal malignant neoplasm                           Normal colonoscopy in college                           Father with colon polyps requiring multiple                            surveillance colonoscopies Medicines:                Monitored Anesthesia Care Procedure:                Pre-Anesthesia Assessment:                           - Prior to the procedure, a History and Physical                            was performed, and patient medications and                            allergies were reviewed. The patient's tolerance of                            previous anesthesia was also reviewed. The risks                            and benefits of the procedure and the sedation                            options and risks were discussed with the patient.                            All questions were answered, and informed consent                            was obtained. Prior Anticoagulants: The patient has                            taken no previous anticoagulant or antiplatelet                            agents. ASA Grade Assessment: II - A patient with                            mild systemic disease. After reviewing the risks  and benefits, the patient was deemed in                            satisfactory condition to undergo the procedure.                           After obtaining informed consent, the colonoscope                            was passed under direct vision. Throughout the                            procedure, the patient's blood pressure, pulse, and                            oxygen saturations were monitored continuously. The                             Olympus CF-HQ190 (#[id number]) Colonoscope was                            introduced through the anus and advanced to the 3                            cm into the ileum. A second forward view of the                            right colon was performed. The colonoscopy was                            performed without difficulty. The patient tolerated                            the procedure well. The quality of the bowel                            preparation was good. The terminal ileum, ileocecal                            valve, appendiceal orifice, and rectum were                            photographed. Scope In: 2:21:02 PM Scope Out: 2:38:15 PM Scope Withdrawal Time: 0 hours 14 minutes 37 seconds  Total Procedure Duration: 0 hours 17 minutes 13 seconds  Findings:                 The perianal and digital rectal examinations were                            normal.                           A 1-2 mm polyp was found in the sigmoid colon. The  polyp was sessile. The polyp was removed with a                            cold snare. Resection and retrieval were complete.                            Estimated blood loss was minimal.                           A few small-mouthed diverticula were found in the                            sigmoid colon.                           The exam was otherwise without abnormality on                            direct and retroflexion views. Complications:            No immediate complications. Estimated blood loss:                            Minimal. Estimated Blood Loss:     Estimated blood loss was minimal. Impression:               - One 1-2 mm polyp in the sigmoid colon, removed                            with a cold snare. Resected and retrieved.                           - Diverticulosis in the sigmoid colon.                           - The examination was otherwise normal on direct                            and  retroflexion views. Recommendation:           - Patient has a contact number available for                            emergencies. The signs and symptoms of potential                            delayed complications were discussed with the                            patient. Return to normal activities tomorrow.                            Written discharge instructions were provided to the                            patient.                           -  Follow a high fiber diet. Drink at least 64                            ounces of water daily. Add a daily stool bulking                            agent such as psyllium (an exampled would be                            Metamucil).                           - Continue present medications.                           - Await pathology results.                           - Repeat colonoscopy date to be determined after                            pending pathology results are reviewed for                            surveillance.                           - Emerging evidence supports eating a diet of                            fruits, vegetables, grains, calcium, and yogurt                            while reducing red meat and alcohol may reduce the                            risk of colon cancer.                           - Thank you for allowing me to be involved in your                            colon cancer prevention. Thornton Park MD, MD 09/20/2020 2:42:28 PM This report has been signed electronically.

## 2020-09-24 ENCOUNTER — Telehealth: Payer: Self-pay | Admitting: *Deleted

## 2020-09-24 NOTE — Telephone Encounter (Signed)
°  Follow up Call-  Call back number 09/20/2020  Post procedure Call Back phone  # [phone]  Permission to leave phone message Yes  Some recent data might be hidden     Patient questions:  Do you have a fever, pain , or abdominal swelling? No. Pain Score  0 *  Have you tolerated food without any problems? Yes.    Have you been able to return to your normal activities? Yes.    Do you have any questions about your discharge instructions: Diet   No. Medications  No. Follow up visit  No.  Do you have questions or concerns about your Care? No.  Actions: * If pain score is 4 or above: No action needed, pain <4.  1. Have you developed a fever since your procedure? no  2.   Have you had an respiratory symptoms (SOB or cough) since your procedure? no  3.   Have you tested positive for COVID 19 since your procedure no  4.   Have you had any family members/close contacts diagnosed with the COVID 19 since your procedure?  no   If yes to any of these questions please route to Joylene John, RN and Joella Prince, RN

## 2020-09-28 ENCOUNTER — Encounter: Payer: Self-pay | Admitting: Gastroenterology

## 2021-07-09 ENCOUNTER — Other Ambulatory Visit: Payer: Self-pay | Admitting: Obstetrics and Gynecology

## 2021-07-09 DIAGNOSIS — Z1231 Encounter for screening mammogram for malignant neoplasm of breast: Secondary | ICD-10-CM

## 2021-09-20 ENCOUNTER — Other Ambulatory Visit: Payer: Self-pay

## 2021-09-20 ENCOUNTER — Ambulatory Visit
Admission: RE | Admit: 2021-09-20 | Discharge: 2021-09-20 | Disposition: A | Discharge status: Home / Self Care | Payer: BC Managed Care – PPO | Source: Ambulatory Visit | Attending: Obstetrics and Gynecology | Admitting: Obstetrics and Gynecology

## 2021-09-20 DIAGNOSIS — Z1231 Encounter for screening mammogram for malignant neoplasm of breast: Secondary | ICD-10-CM

## 2022-08-14 ENCOUNTER — Other Ambulatory Visit: Payer: Self-pay | Admitting: Obstetrics and Gynecology

## 2022-08-14 DIAGNOSIS — Z1231 Encounter for screening mammogram for malignant neoplasm of breast: Secondary | ICD-10-CM

## 2022-09-17 IMAGING — MG MM DIGITAL SCREENING BILAT W/ TOMO AND CAD
8 series · 8 of 24 positions shown · non-contrast
Comparison: Previous exam(s).

CLINICAL DATA: Screening.

EXAM:
DIGITAL SCREENING BILATERAL MAMMOGRAM WITH TOMOSYNTHESIS AND CAD
TECHNIQUE: Bilateral screening digital craniocaudal and mediolateral oblique
mammograms were obtained. Bilateral screening digital breast
tomosynthesis was performed. The images were evaluated with
computer-aided detection.

[R MLO synth-2D]
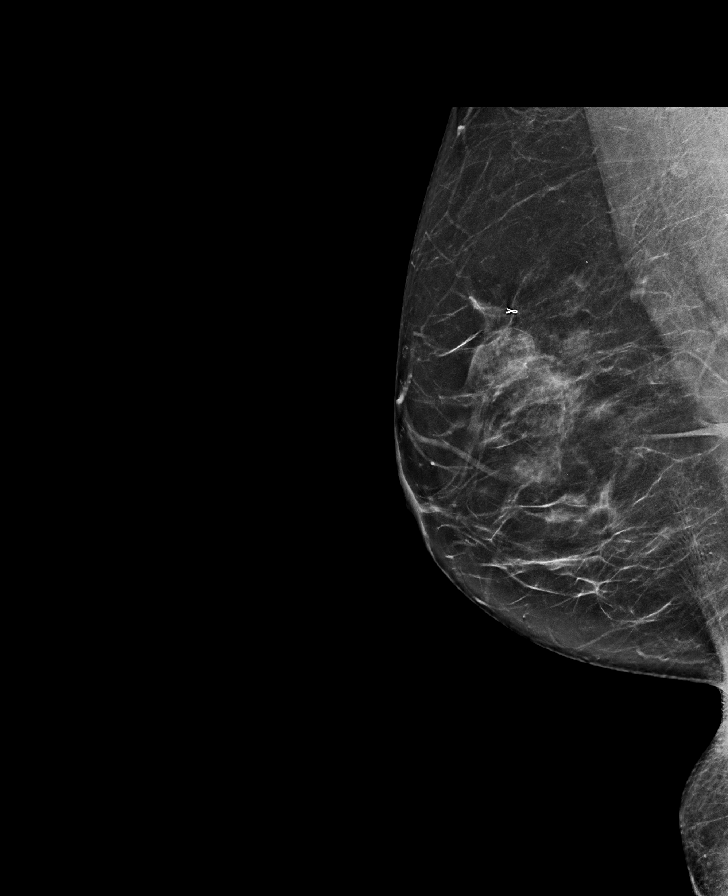

[L CC synth-2D]
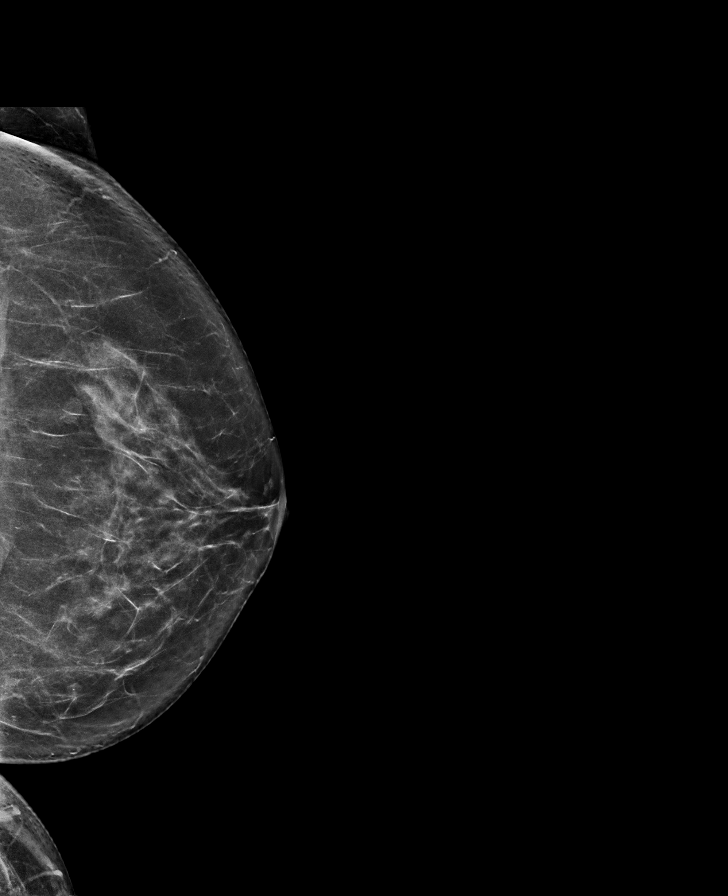

[R CC synth-2D]
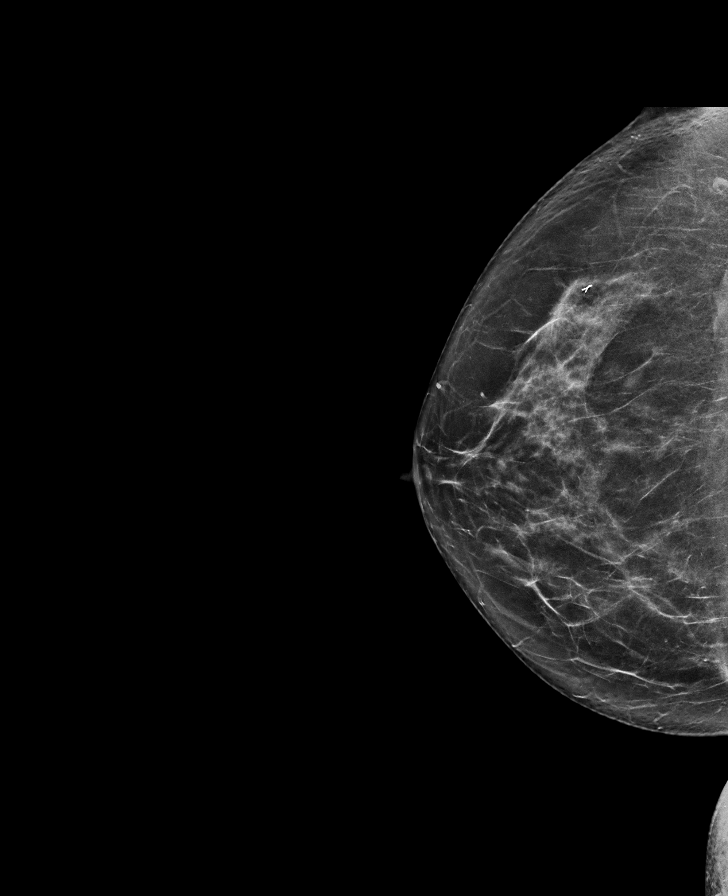

[L MLO synth-2D]
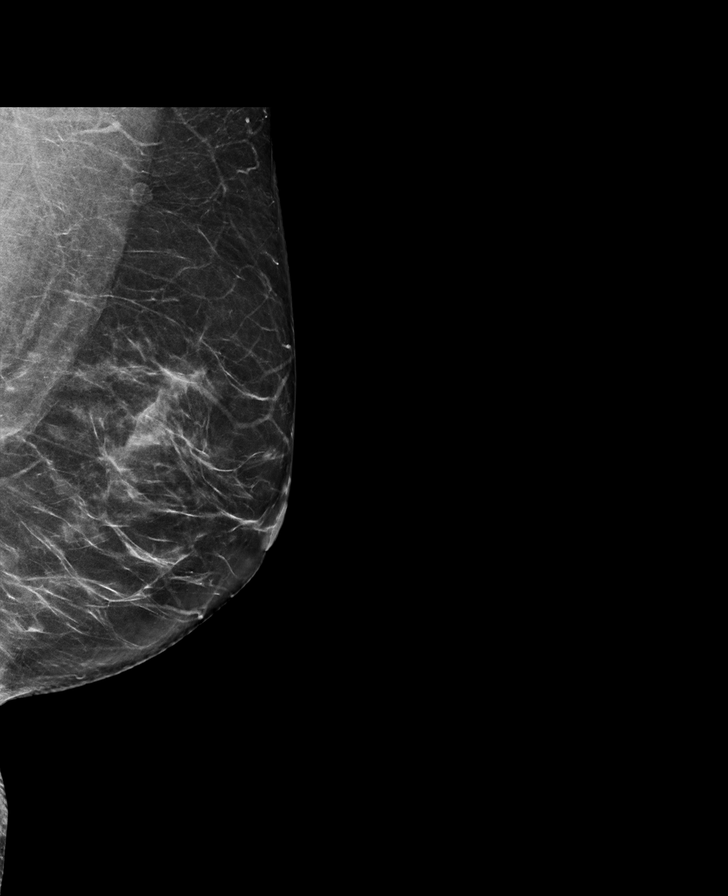

[R MLO tomo · tomo slice 37/72.0]
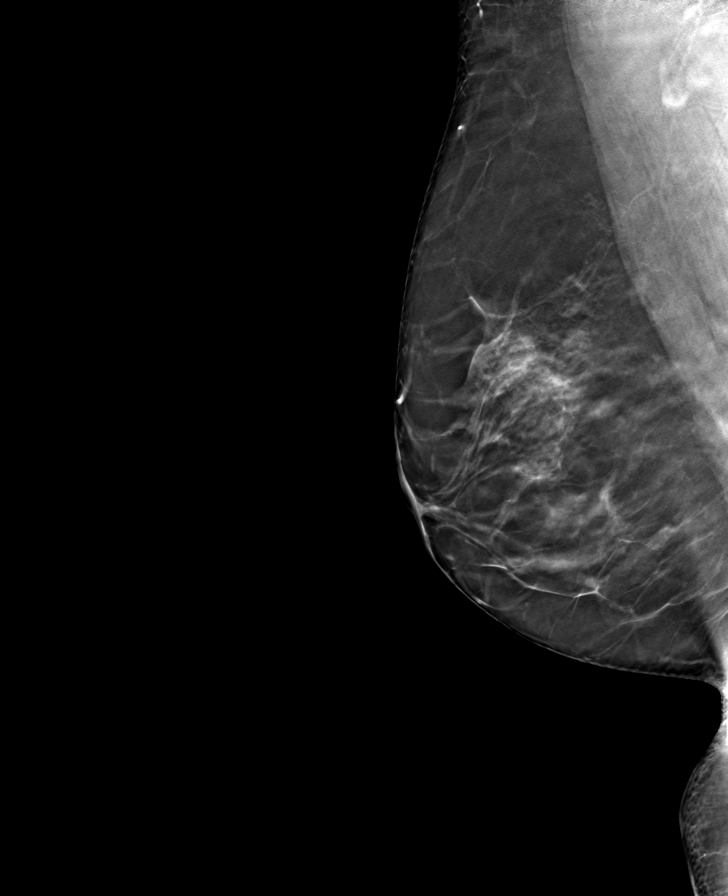

[L MLO tomo · tomo slice 37/73.0]
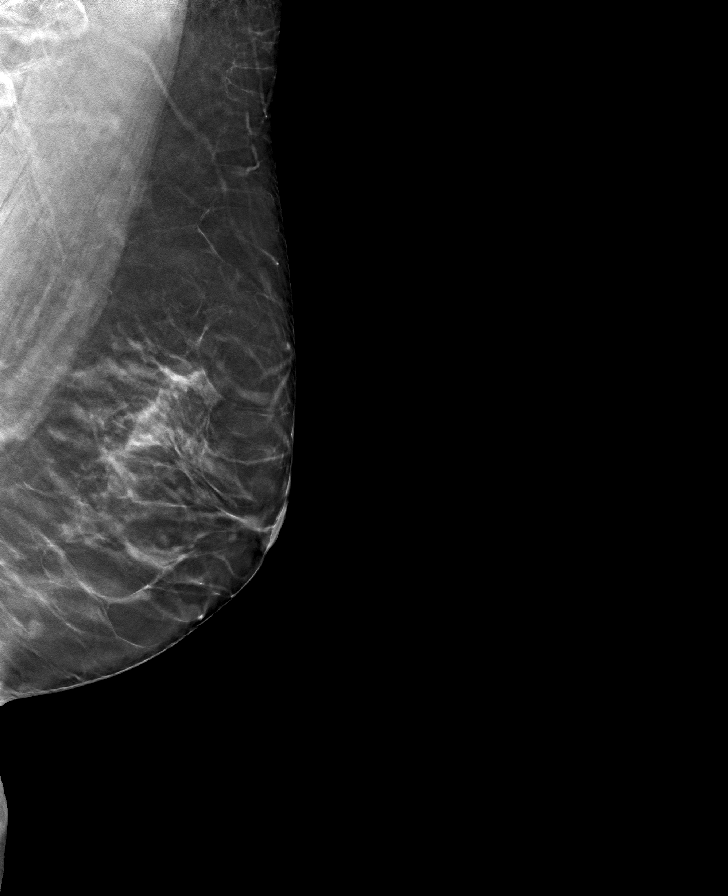

[R CC tomo · tomo slice 36/71.0]
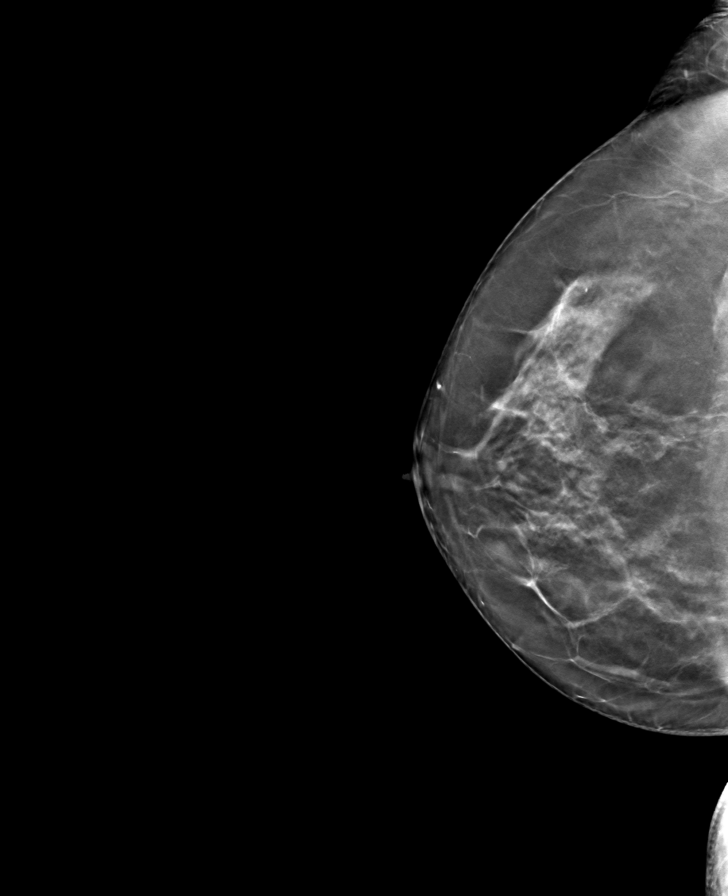

[L CC tomo · tomo slice 36/71.0]
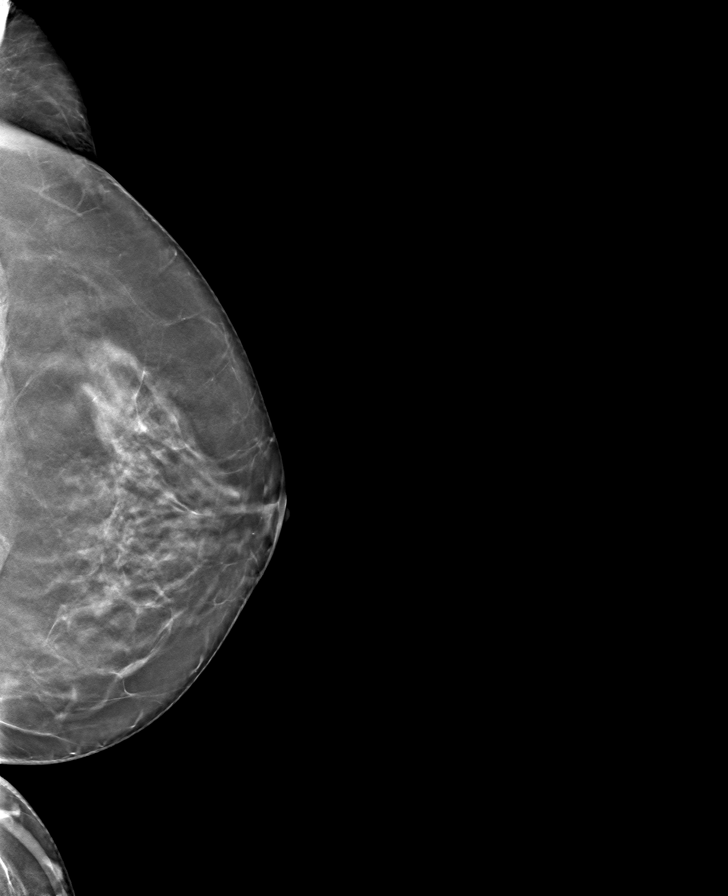

[8 of 24 positions shown; findings below may reference images not displayed]

ACR Breast Density Category c: The breast tissue is heterogeneously
dense, which may obscure small masses.
FINDINGS: There are no findings suspicious for malignancy.
IMPRESSION: No mammographic evidence of malignancy. A result letter of this
screening mammogram will be mailed directly to the patient.

RECOMMENDATION:
Screening mammogram in one year. (Code:Q3-W-BC3)

BI-RADS CATEGORY  1: Negative.

## 2022-10-07 ENCOUNTER — Ambulatory Visit
Admission: RE | Admit: 2022-10-07 | Discharge: 2022-10-07 | Disposition: A | Discharge status: Home / Self Care | Payer: BC Managed Care – PPO | Source: Ambulatory Visit | Attending: Obstetrics and Gynecology | Admitting: Obstetrics and Gynecology

## 2022-10-07 DIAGNOSIS — Z1231 Encounter for screening mammogram for malignant neoplasm of breast: Secondary | ICD-10-CM

## 2023-08-13 ENCOUNTER — Other Ambulatory Visit: Payer: Self-pay | Admitting: Obstetrics and Gynecology

## 2023-08-13 DIAGNOSIS — Z1231 Encounter for screening mammogram for malignant neoplasm of breast: Secondary | ICD-10-CM

## 2023-10-08 ENCOUNTER — Ambulatory Visit
Admission: RE | Admit: 2023-10-08 | Discharge: 2023-10-08 | Disposition: A | Discharge status: Home / Self Care | Payer: BC Managed Care – PPO | Source: Ambulatory Visit | Attending: Obstetrics and Gynecology | Admitting: Obstetrics and Gynecology

## 2023-10-08 DIAGNOSIS — Z1231 Encounter for screening mammogram for malignant neoplasm of breast: Secondary | ICD-10-CM
